# Patient Record
Sex: Female | Born: 1945
Health system: Southern US, Community
[De-identification: ages and names within clinical notes are randomized; demographics above are authoritative.]

## PROBLEM LIST (undated history)

## (undated) DIAGNOSIS — Z87442 Personal history of urinary calculi: Secondary | ICD-10-CM

## (undated) DIAGNOSIS — R079 Chest pain, unspecified: Secondary | ICD-10-CM

## (undated) DIAGNOSIS — R011 Cardiac murmur, unspecified: Secondary | ICD-10-CM

## (undated) DIAGNOSIS — I10 Essential (primary) hypertension: Secondary | ICD-10-CM

## (undated) DIAGNOSIS — L719 Rosacea, unspecified: Secondary | ICD-10-CM

## (undated) DIAGNOSIS — N939 Abnormal uterine and vaginal bleeding, unspecified: Secondary | ICD-10-CM

## (undated) DIAGNOSIS — M1990 Unspecified osteoarthritis, unspecified site: Secondary | ICD-10-CM

## (undated) DIAGNOSIS — M8580 Other specified disorders of bone density and structure, unspecified site: Secondary | ICD-10-CM

## (undated) DIAGNOSIS — R112 Nausea with vomiting, unspecified: Secondary | ICD-10-CM

## (undated) DIAGNOSIS — Z9889 Other specified postprocedural states: Secondary | ICD-10-CM

## (undated) HISTORY — DX: Other specified disorders of bone density and structure, unspecified site: M85.80

## (undated) HISTORY — PX: BREAST EXCISIONAL BIOPSY: SUR124

## (undated) HISTORY — DX: Essential (primary) hypertension: I10

## (undated) HISTORY — PX: OTHER SURGICAL HISTORY: SHX169

## (undated) HISTORY — DX: Chest pain, unspecified: R07.9

## (undated) HISTORY — PX: TONSILLECTOMY AND ADENOIDECTOMY: SUR1326

---

## 1977-03-13 DIAGNOSIS — Z9889 Other specified postprocedural states: Secondary | ICD-10-CM

## 1977-03-13 DIAGNOSIS — R112 Nausea with vomiting, unspecified: Secondary | ICD-10-CM

## 1977-03-13 HISTORY — DX: Other specified postprocedural states: Z98.890

## 1977-03-13 HISTORY — PX: LAPAROSCOPY: SHX197

## 1977-03-13 HISTORY — DX: Nausea with vomiting, unspecified: R11.2

## 1997-07-13 ENCOUNTER — Other Ambulatory Visit: Admission: RE | Admit: 1997-07-13 | Discharge: 1997-07-13 | Payer: Self-pay | Admitting: Gynecology

## 1998-07-15 ENCOUNTER — Other Ambulatory Visit: Admission: RE | Admit: 1998-07-15 | Discharge: 1998-07-15 | Payer: Self-pay | Admitting: Gynecology

## 1998-07-29 ENCOUNTER — Other Ambulatory Visit: Admission: RE | Admit: 1998-07-29 | Discharge: 1998-07-29 | Payer: Self-pay | Admitting: Gynecology

## 1999-02-21 ENCOUNTER — Encounter: Payer: Self-pay | Admitting: Gynecology

## 1999-02-21 ENCOUNTER — Encounter: Admission: RE | Admit: 1999-02-21 | Discharge: 1999-02-21 | Payer: Self-pay | Admitting: Gynecology

## 1999-07-27 ENCOUNTER — Other Ambulatory Visit: Admission: RE | Admit: 1999-07-27 | Discharge: 1999-07-27 | Payer: Self-pay | Admitting: Gynecology

## 1999-10-06 ENCOUNTER — Encounter: Admission: RE | Admit: 1999-10-06 | Discharge: 1999-10-06 | Payer: Self-pay | Admitting: Gynecology

## 1999-10-06 ENCOUNTER — Encounter: Payer: Self-pay | Admitting: Gynecology

## 2000-02-21 ENCOUNTER — Encounter: Admission: RE | Admit: 2000-02-21 | Discharge: 2000-02-21 | Payer: Self-pay | Admitting: Gynecology

## 2000-02-21 ENCOUNTER — Encounter: Payer: Self-pay | Admitting: Gynecology

## 2000-02-27 ENCOUNTER — Encounter: Payer: Self-pay | Admitting: Gynecology

## 2000-02-27 ENCOUNTER — Encounter: Admission: RE | Admit: 2000-02-27 | Discharge: 2000-02-27 | Payer: Self-pay | Admitting: Gynecology

## 2000-08-08 ENCOUNTER — Other Ambulatory Visit: Admission: RE | Admit: 2000-08-08 | Discharge: 2000-08-08 | Payer: Self-pay | Admitting: Gynecology

## 2000-08-14 ENCOUNTER — Encounter: Admission: RE | Admit: 2000-08-14 | Discharge: 2000-08-14 | Payer: Self-pay | Admitting: Gynecology

## 2000-08-14 ENCOUNTER — Encounter: Payer: Self-pay | Admitting: Gynecology

## 2001-02-22 ENCOUNTER — Encounter: Payer: Self-pay | Admitting: Gynecology

## 2001-02-22 ENCOUNTER — Encounter: Admission: RE | Admit: 2001-02-22 | Discharge: 2001-02-22 | Payer: Self-pay | Admitting: Gynecology

## 2001-08-09 ENCOUNTER — Other Ambulatory Visit: Admission: RE | Admit: 2001-08-09 | Discharge: 2001-08-09 | Payer: Self-pay | Admitting: Gynecology

## 2002-03-10 ENCOUNTER — Encounter: Payer: Self-pay | Admitting: Gynecology

## 2002-03-10 ENCOUNTER — Encounter: Admission: RE | Admit: 2002-03-10 | Discharge: 2002-03-10 | Payer: Self-pay | Admitting: Gynecology

## 2002-07-31 ENCOUNTER — Other Ambulatory Visit: Admission: RE | Admit: 2002-07-31 | Discharge: 2002-07-31 | Payer: Self-pay | Admitting: Gynecology

## 2003-02-19 ENCOUNTER — Encounter: Admission: RE | Admit: 2003-02-19 | Discharge: 2003-02-19 | Payer: Self-pay | Admitting: Gynecology

## 2003-08-06 ENCOUNTER — Other Ambulatory Visit: Admission: RE | Admit: 2003-08-06 | Discharge: 2003-08-06 | Payer: Self-pay | Admitting: Gynecology

## 2004-03-11 ENCOUNTER — Ambulatory Visit (HOSPITAL_COMMUNITY): Admission: RE | Admit: 2004-03-11 | Discharge: 2004-03-11 | Payer: Self-pay | Admitting: Gynecology

## 2004-03-31 ENCOUNTER — Encounter: Admission: RE | Admit: 2004-03-31 | Discharge: 2004-03-31 | Payer: Self-pay | Admitting: Gynecology

## 2004-07-26 ENCOUNTER — Other Ambulatory Visit: Admission: RE | Admit: 2004-07-26 | Discharge: 2004-07-26 | Payer: Self-pay | Admitting: Gynecology

## 2005-03-29 ENCOUNTER — Encounter: Admission: RE | Admit: 2005-03-29 | Discharge: 2005-03-29 | Payer: Self-pay | Admitting: Gynecology

## 2005-08-10 ENCOUNTER — Other Ambulatory Visit: Admission: RE | Admit: 2005-08-10 | Discharge: 2005-08-10 | Payer: Self-pay | Admitting: Gynecology

## 2005-11-09 ENCOUNTER — Encounter: Admission: RE | Admit: 2005-11-09 | Discharge: 2005-11-09 | Payer: Self-pay | Admitting: Family Medicine

## 2006-03-30 ENCOUNTER — Encounter: Admission: RE | Admit: 2006-03-30 | Discharge: 2006-03-30 | Payer: Self-pay | Admitting: Gynecology

## 2006-07-17 ENCOUNTER — Other Ambulatory Visit: Admission: RE | Admit: 2006-07-17 | Discharge: 2006-07-17 | Payer: Self-pay | Admitting: Gynecology

## 2007-04-04 ENCOUNTER — Encounter: Admission: RE | Admit: 2007-04-04 | Discharge: 2007-04-04 | Payer: Self-pay | Admitting: Gynecology

## 2008-04-06 ENCOUNTER — Encounter: Admission: RE | Admit: 2008-04-06 | Discharge: 2008-04-06 | Payer: Self-pay | Admitting: Gynecology

## 2009-04-08 ENCOUNTER — Encounter: Admission: RE | Admit: 2009-04-08 | Discharge: 2009-04-08 | Payer: Self-pay | Admitting: Gynecology

## 2010-03-13 DIAGNOSIS — I209 Angina pectoris, unspecified: Secondary | ICD-10-CM

## 2010-03-13 HISTORY — DX: Angina pectoris, unspecified: I20.9

## 2010-04-03 ENCOUNTER — Encounter: Payer: Self-pay | Admitting: Gynecology

## 2010-04-11 ENCOUNTER — Encounter
Admission: RE | Admit: 2010-04-11 | Discharge: 2010-04-11 | Payer: Self-pay | Source: Home / Self Care | Attending: Gynecology | Admitting: Gynecology

## 2010-04-12 ENCOUNTER — Other Ambulatory Visit: Payer: Self-pay | Admitting: Gynecology

## 2010-04-12 DIAGNOSIS — R928 Other abnormal and inconclusive findings on diagnostic imaging of breast: Secondary | ICD-10-CM

## 2010-04-19 ENCOUNTER — Ambulatory Visit
Admission: RE | Admit: 2010-04-19 | Discharge: 2010-04-19 | Disposition: A | Discharge status: Home / Self Care | Payer: Medicare Other | Source: Ambulatory Visit | Attending: Gynecology | Admitting: Gynecology

## 2010-04-19 DIAGNOSIS — R928 Other abnormal and inconclusive findings on diagnostic imaging of breast: Secondary | ICD-10-CM

## 2010-08-24 ENCOUNTER — Encounter: Payer: Self-pay | Admitting: Cardiology

## 2010-08-29 ENCOUNTER — Encounter: Payer: Self-pay | Admitting: Cardiology

## 2010-08-29 ENCOUNTER — Ambulatory Visit (INDEPENDENT_AMBULATORY_CARE_PROVIDER_SITE_OTHER): Payer: Medicare Other | Admitting: Cardiology

## 2010-08-29 DIAGNOSIS — E7800 Pure hypercholesterolemia, unspecified: Secondary | ICD-10-CM

## 2010-08-29 DIAGNOSIS — I10 Essential (primary) hypertension: Secondary | ICD-10-CM

## 2010-08-29 DIAGNOSIS — R079 Chest pain, unspecified: Secondary | ICD-10-CM

## 2010-08-29 DIAGNOSIS — E785 Hyperlipidemia, unspecified: Secondary | ICD-10-CM

## 2010-08-29 NOTE — Patient Instructions (Signed)
We will schedule you for a stress nuclear study.  Continue your current medications.

## 2010-08-30 ENCOUNTER — Encounter: Payer: Self-pay | Admitting: *Deleted

## 2010-08-30 DIAGNOSIS — E7800 Pure hypercholesterolemia, unspecified: Secondary | ICD-10-CM | POA: Insufficient documentation

## 2010-08-30 DIAGNOSIS — I10 Essential (primary) hypertension: Secondary | ICD-10-CM | POA: Insufficient documentation

## 2010-08-30 DIAGNOSIS — R079 Chest pain, unspecified: Secondary | ICD-10-CM | POA: Insufficient documentation

## 2010-08-30 NOTE — Assessment & Plan Note (Signed)
Symptoms are concerning for angina. Her risk factors are hypertension, mild hypercholesterolemia, and the fact that she is postmenopausal. I recommended a nuclear stress test to further stratify her risk. If abnormal she will need a cardiac catheterization. Also if abnormal we will need to consider lipid-lowering therapy.

## 2010-08-30 NOTE — Progress Notes (Signed)
[name] Date of Birth: 01-Jul-1945   History of Present Illness: Mrs. Cathy Greene is a pleasant 65 year old white female referred by Dr. Nicholas Lose for evaluation of exertional chest and elbow pain. She has a history of hypertension and mild dyslipidemia. She is postmenopausal and has been on hormone replacement therapy since her late 61s. She reports that over the past 2 months she has had symptoms of left elbow and chest pain when walking her dog. She describes as a severe ache in her left elbow and tightness in her mid chest. If she continues to walk it goes away eventually. It is relieved with rest. It occurs daily and virtually each time that she walks. She denies any shortness of breath, nausea vomiting, or diaphoresis. She does report a history of mitral valve prolapse and had an echocardiogram several years ago.  Current Outpatient Prescriptions on File Prior to Visit  Medication Sig Dispense Refill  . Calcium Carbonate-Vit D-Min (CALTRATE 600+D PLUS) 600-400 MG-UNIT per tablet Take 1 tablet by mouth 2 (two) times daily.        . Cholecalciferol (VITAMIN D PO) Take by mouth daily.        Marland Kitchen estradiol (VIVELLE-DOT) 0.075 MG/24HR Place 0.5 patches onto the skin 2 (two) times a week.       Marland Kitchen lisinopril-hydrochlorothiazide (PRINZIDE,ZESTORETIC) 20-25 MG per tablet Take 1 tablet by mouth daily.        . Multiple Vitamins-Minerals (ICAPS PO) Take 1 capsule by mouth 2 (two) times daily.        . progesterone (PROMETRIUM) 200 MG capsule Take 200 mg by mouth daily.        . Sulfacetamide Sodium 10 % SUSP 1 application as directed.        Allergies  Allergen Reactions  . Codeine     Past Medical History  Diagnosis Date  . Chest discomfort   . Hypertension   . Osteopenia   . Hypertrophic osteoarthritis   . Elbow pain, left     Past Surgical History  Procedure Date  . Cesarean section     X3  . Breast biopsy   . Breast cyst aspiration   . Tonsillectomy     History  Smoking status  .  Never Smoker   Smokeless tobacco  . Never Used    History  Alcohol Use No    Family History  Problem Relation Age of Onset  . Lung cancer Father   . Hypertension Mother   . Hypertension Sister   . Arthritis Mother   . Arthritis Sister   . Cancer Father   . Asthma Sister   . Diabetes Maternal Grandmother     Review of Systems: The review of systems is positive for arthritis.  All other systems were reviewed and are negative.  Physical Exam: BP 118/72  Pulse 80  Ht 5\' 6"  (1.676 m)  Wt 170 lb 8 oz (77.338 kg)  BMI 27.52 kg/m2 She is a pleasant white female in no acute distress. She is normocephalic, atraumatic. Pupils equal round react to light and accommodation. Extraocular movements are full. Sclera clear. Oropharynx is clear. Neck is supple without JVD, adenopathy, thyromegaly, or bruits. Lungs are clear. Cardiac exam reveals a regular rate and rhythm with a soft early systolic click. There is no murmur or gallop. Abdomen is soft and nontender without masses or hepatosplenomegaly. Femoral and pedal pulses are 2+ and symmetric. Skin is warm and dry. She is alert and oriented x3. Cranial nerves II  through XII are intact. LABORATORY DATA: Complete chemistry panel is normal. Cholesterol 197, LDL 115, HDL 58, triglycerides 82. ECG today is normal.  Assessment / Plan:

## 2010-08-31 ENCOUNTER — Encounter: Payer: Self-pay | Admitting: Cardiology

## 2010-09-05 ENCOUNTER — Ambulatory Visit (HOSPITAL_COMMUNITY): Payer: Medicare Other | Attending: Cardiology | Admitting: Radiology

## 2010-09-05 DIAGNOSIS — I10 Essential (primary) hypertension: Secondary | ICD-10-CM | POA: Insufficient documentation

## 2010-09-05 DIAGNOSIS — R079 Chest pain, unspecified: Secondary | ICD-10-CM | POA: Insufficient documentation

## 2010-09-05 DIAGNOSIS — I4949 Other premature depolarization: Secondary | ICD-10-CM

## 2010-09-05 DIAGNOSIS — R0789 Other chest pain: Secondary | ICD-10-CM

## 2010-09-05 DIAGNOSIS — I491 Atrial premature depolarization: Secondary | ICD-10-CM

## 2010-09-05 MED ORDER — TECHNETIUM TC 99M TETROFOSMIN IV KIT
11.0000 | PACK | Freq: Once | INTRAVENOUS | Status: AC | PRN
  Administered 2010-09-05: 11 via INTRAVENOUS

## 2010-09-05 MED ORDER — TECHNETIUM TC 99M TETROFOSMIN IV KIT
33.0000 | PACK | Freq: Once | INTRAVENOUS | Status: AC | PRN
  Administered 2010-09-05: 33 via INTRAVENOUS

## 2010-09-05 NOTE — Progress Notes (Signed)
[facility] SITE 3 NUCLEAR MED [address] [place] [address] [phone]  Cardiology Nuclear Med Study  TAILYN HANTZ is a 65 y.o. female [id number] 1945-04-26   Nuclear Med Background Indication for Stress Test:  Evaluation for Ischemia History:  ~10 yrs ago Echo:OK per patient, MVP. Cardiac Risk Factors: History of Smoking, Hypertension and Lipids  Symptoms:  Chest Tightness>Elbow with Exertion (last episode of chest discomfort was last week)   Nuclear Pre-Procedure Caffeine/Decaff Intake:  9:00pm NPO After: 7:30am   Lungs:  Clear. IV 0.9% NS with Angio Cath:  22g  IV Site: R Hand  IV Started by:  Cathlyn Parsons, RN  Chest Size (in):  36 Cup Size: B  Height: 5\' 6"  (1.676 m)  Weight:  168 lb (76.204 kg)  BMI:  Body mass index is 27.12 kg/(m^2). Tech Comments:  n/a    Nuclear Med Study 1 or 2 day study: 1 day  Stress Test Type:  Stress  Reading MD: Dietrich Pates, MD  Order Authorizing Provider:  Peter Swaziland, MD  Resting Radionuclide: Technetium 29m Tetrofosmin  Resting Radionuclide Dose: 11.0 mCi   Stress Radionuclide:  Technetium 1m Tetrofosmin  Stress Radionuclide Dose: 33.0 mCi           Stress Protocol Rest HR: 72 Stress HR: 155  Rest BP: 130/66 Stress BP: 185/78  Exercise Time (min): 6:00 METS: 7.0   Predicted Max HR: 154 bpm % Max HR: 100.65 bpm Rate Pressure Product: 78469   Dose of Adenosine (mg):  n/a Dose of Lexiscan: n/a mg  Dose of Atropine (mg): n/a Dose of Dobutamine: n/a mcg/kg/min (at max HR)  Stress Test Technologist: Smiley Houseman, CMA-N  Nuclear Technologist:  Domenic Polite, CNMT     Rest Procedure:  Myocardial perfusion imaging was performed at rest 45 minutes following the intravenous administration of Technetium 31m Tetrofosmin.  Rest ECG: No acute changes, rare PVC.  Stress Procedure:  The patient exercised for six minutes on the treadmill utilizing the Bruce protocol.  The patient stopped due to fatigue and  denied any chest pain.  There were no significant ST-T wave changes, only occasional PVC's and PAC's.  Technetium 26m Tetrofosmin was injected at peak exercise and myocardial perfusion imaging was performed after a brief delay.  Stress ECG: No significant change from baseline ECG  QPS Raw Data Images:  Soft tissue (diaphragm) underlies heart. Stress Images:  Normal homogeneous uptake in all areas of the myocardium. Rest Images:  Normal homogeneous uptake in all areas of the myocardium. Subtraction (SDS):  No evidence of ischemia. Transient Ischemic Dilatation (Normal <1.22):  1.06 Lung/Heart Ratio (Normal <0.45):  0.32  Quantitative Gated Spect Images QGS EDV:  70 ml QGS ESV:  24 ml QGS cine images:  NL LV Function; NL Wall Motion QGS EF: 66%  Impression Exercise Capacity:  Fair exercise capacity. BP Response:  Normal blood pressure response. Clinical Symptoms:  No chest pain. ECG Impression:  No significant ST segment change suggestive of ischemia. Comparison with Prior Nuclear Study: No previous nuclear study performed  Overall Impression:  Normal stress nuclear study.

## 2010-09-06 NOTE — Progress Notes (Signed)
Nuc med report routed to Dr. Swaziland 09/06/10 Domenic Polite

## 2010-09-06 NOTE — Progress Notes (Signed)
Nuclear stress test is normal. EF is normal. This is reassuring but I would like to see her in 3 weeks or so to see how her chest pain is doing. She should call if her pain worsens.

## 2010-09-07 NOTE — Progress Notes (Signed)
Lm to call w/ nuclear stress test results. Advised to call back.

## 2010-09-07 NOTE — Progress Notes (Signed)
Called back. Gave her results. Made her an app to see [place] 7/20. States her pain was minimal yest when she walked. Advised to call us if pain worsens or go to ER at [facility].

## 2010-09-30 ENCOUNTER — Ambulatory Visit (INDEPENDENT_AMBULATORY_CARE_PROVIDER_SITE_OTHER): Payer: Medicare Other | Admitting: Cardiology

## 2010-09-30 ENCOUNTER — Encounter: Payer: Self-pay | Admitting: Cardiology

## 2010-09-30 DIAGNOSIS — R079 Chest pain, unspecified: Secondary | ICD-10-CM

## 2010-09-30 DIAGNOSIS — I10 Essential (primary) hypertension: Secondary | ICD-10-CM

## 2010-09-30 DIAGNOSIS — E7800 Pure hypercholesterolemia, unspecified: Secondary | ICD-10-CM

## 2010-09-30 NOTE — Progress Notes (Signed)
   Cathy Greene Date of Birth: 31-Aug-1945   History of Present Illness: Cathy Greene is seen for followup after her recent stress Myoview study. Since her last visit her chest discomfort symptoms have improved significantly. She actually visiting family in [place] and did walking at altitude without any discomfort. She thinks it is thought she meant air here that is associated with her pain. She still has had some minor symptoms walking here but it is improved.  Current Outpatient Prescriptions on File Prior to Visit  Medication Sig Dispense Refill  . Cholecalciferol (VITAMIN D PO) Take by mouth daily.        Marland Kitchen estradiol (VIVELLE-DOT) 0.075 MG/24HR Place 0.5 patches onto the skin once a week.       Marland Kitchen lisinopril-hydrochlorothiazide (PRINZIDE,ZESTORETIC) 20-25 MG per tablet Take 1 tablet by mouth daily.        . Multiple Vitamins-Minerals (ICAPS PO) Take 1 capsule by mouth 2 (two) times daily.       . progesterone (PROMETRIUM) 200 MG capsule Take 200 mg by mouth every 3 (three) months.       . Sulfacetamide Sodium 10 % SUSP 1 application as directed.        Allergies  Allergen Reactions  . Codeine     Past Medical History  Diagnosis Date  . Chest discomfort   . Hypertension   . Osteopenia   . Hypertrophic osteoarthritis   . Elbow pain, left     Past Surgical History  Procedure Date  . Cesarean section     X3  . Breast biopsy   . Breast cyst aspiration   . Tonsillectomy     History  Smoking status  . Never Smoker   Smokeless tobacco  . Never Used    History  Alcohol Use No    Family History  Problem Relation Age of Onset  . Lung cancer Father   . Hypertension Mother   . Hypertension Sister   . Arthritis Mother   . Arthritis Sister   . Cancer Father   . Asthma Sister   . Diabetes Maternal Grandmother     Review of Systems: The review of systems is positive for arthritis.  All other systems were reviewed and are negative.  Physical Exam: BP 140/88   Pulse 76  Ht 5\' 5"  (1.651 m)  Wt 170 lb (77.111 kg)  BMI 28.29 kg/m2 She is a pleasant white female in no acute distress. She is normocephalic, atraumatic. Pupils equal round react to light and accommodation. Extraocular movements are full. Sclera clear. Oropharynx is clear. Neck is supple without JVD, adenopathy, thyromegaly, or bruits. Lungs are clear. Cardiac exam reveals a regular rate and rhythm with a soft early systolic click. There is no murmur or gallop. Abdomen is soft and nontender without masses or hepatosplenomegaly. Femoral and pedal pulses are 2+ and symmetric. Skin is warm and dry. She is alert and oriented x3. Cranial nerves II through XII are intact. LABORATORY DATA: She was able to walk for 6 minutes on the treadmill without any symptoms of chest pain. Her ECG showed no acute changes. Her Cardiolite images were normal with a normal ejection fraction.  Assessment / Plan:

## 2010-09-30 NOTE — Assessment & Plan Note (Signed)
Her blood pressure is well controlled on current medications. 

## 2010-09-30 NOTE — Assessment & Plan Note (Signed)
Her stress Myoview would indicate a low risk of coronary disease. Her chest symptoms are abating. I would recommend continued risk factor modification. If she has any worsening chest pain then we will need to reevaluate.

## 2010-09-30 NOTE — Patient Instructions (Signed)
Follow a heart healthy diet.  Get regular exercise.  Call if you have any worsening chest pain.

## 2010-09-30 NOTE — Assessment & Plan Note (Signed)
Previous LDL was 115. I recommended last modification with a heart healthy diet and regular aerobic exercise. Her goal would be to get her LDL less than 100.

## 2011-03-14 HISTORY — PX: OTHER SURGICAL HISTORY: SHX169

## 2011-03-23 ENCOUNTER — Other Ambulatory Visit: Payer: Self-pay | Admitting: Family Medicine

## 2011-03-23 DIAGNOSIS — Z1231 Encounter for screening mammogram for malignant neoplasm of breast: Secondary | ICD-10-CM

## 2011-04-21 ENCOUNTER — Ambulatory Visit
Admission: RE | Admit: 2011-04-21 | Discharge: 2011-04-21 | Disposition: A | Discharge status: Home / Self Care | Payer: Medicare Other | Source: Ambulatory Visit | Attending: Family Medicine | Admitting: Family Medicine

## 2011-04-21 DIAGNOSIS — Z1231 Encounter for screening mammogram for malignant neoplasm of breast: Secondary | ICD-10-CM

## 2011-07-28 ENCOUNTER — Encounter (HOSPITAL_BASED_OUTPATIENT_CLINIC_OR_DEPARTMENT_OTHER): Payer: Self-pay | Admitting: *Deleted

## 2011-07-28 NOTE — Progress Notes (Signed)
NPO AFTER MN. 1610. NEEDS ISTAT. CURRENT EKG IN CHART.

## 2011-07-31 ENCOUNTER — Encounter (HOSPITAL_BASED_OUTPATIENT_CLINIC_OR_DEPARTMENT_OTHER)
Admission: RE | Disposition: A | Discharge status: Home / Self Care | Payer: Self-pay | Source: Ambulatory Visit | Attending: Gynecology

## 2011-07-31 ENCOUNTER — Encounter (HOSPITAL_BASED_OUTPATIENT_CLINIC_OR_DEPARTMENT_OTHER): Payer: Self-pay | Admitting: *Deleted

## 2011-07-31 ENCOUNTER — Ambulatory Visit (HOSPITAL_BASED_OUTPATIENT_CLINIC_OR_DEPARTMENT_OTHER)
Admission: RE | Admit: 2011-07-31 | Discharge: 2011-07-31 | Disposition: A | Discharge status: Home / Self Care | Payer: Medicare Other | Source: Ambulatory Visit | Attending: Gynecology | Admitting: Gynecology

## 2011-07-31 ENCOUNTER — Encounter (HOSPITAL_BASED_OUTPATIENT_CLINIC_OR_DEPARTMENT_OTHER): Payer: Self-pay | Admitting: Anesthesiology

## 2011-07-31 ENCOUNTER — Ambulatory Visit (HOSPITAL_BASED_OUTPATIENT_CLINIC_OR_DEPARTMENT_OTHER): Payer: Medicare Other | Admitting: Anesthesiology

## 2011-07-31 DIAGNOSIS — I10 Essential (primary) hypertension: Secondary | ICD-10-CM | POA: Insufficient documentation

## 2011-07-31 DIAGNOSIS — N950 Postmenopausal bleeding: Secondary | ICD-10-CM | POA: Insufficient documentation

## 2011-07-31 DIAGNOSIS — R9389 Abnormal findings on diagnostic imaging of other specified body structures: Secondary | ICD-10-CM | POA: Insufficient documentation

## 2011-07-31 HISTORY — DX: Other specified postprocedural states: Z98.890

## 2011-07-31 HISTORY — DX: Unspecified osteoarthritis, unspecified site: M19.90

## 2011-07-31 HISTORY — DX: Abnormal uterine and vaginal bleeding, unspecified: N93.9

## 2011-07-31 HISTORY — DX: Rosacea, unspecified: L71.9

## 2011-07-31 HISTORY — DX: Nausea with vomiting, unspecified: R11.2

## 2011-07-31 LAB — POCT I-STAT 4, (NA,K, GLUC, HGB,HCT): Hemoglobin: 13.9 g/dL (ref 12.0–15.0)

## 2011-07-31 SURGERY — DILATATION & CURETTAGE/HYSTEROSCOPY WITH RESECTOCOPE
Anesthesia: General | Site: Uterus | Wound class: Clean Contaminated

## 2011-07-31 MED ORDER — PROPOFOL 10 MG/ML IV EMUL
INTRAVENOUS | Status: DC | PRN
  Administered 2011-07-31: 180 mg via INTRAVENOUS

## 2011-07-31 MED ORDER — KETOROLAC TROMETHAMINE 30 MG/ML IJ SOLN
INTRAMUSCULAR | Status: DC | PRN
  Administered 2011-07-31: 30 mg via INTRAVENOUS

## 2011-07-31 MED ORDER — LIDOCAINE HCL (CARDIAC) 20 MG/ML IV SOLN
INTRAVENOUS | Status: DC | PRN
  Administered 2011-07-31: 60 mg via INTRAVENOUS

## 2011-07-31 MED ORDER — LACTATED RINGERS IV SOLN
INTRAVENOUS | Status: DC
  Administered 2011-07-31 (×3): via INTRAVENOUS

## 2011-07-31 MED ORDER — PROMETHAZINE HCL 25 MG/ML IJ SOLN: 6.2500 mg | INTRAMUSCULAR | Status: DC | PRN

## 2011-07-31 MED ORDER — LACTATED RINGERS IV SOLN: INTRAVENOUS | Status: DC

## 2011-07-31 MED ORDER — GLYCINE 1.5 % IR SOLN
Status: DC | PRN
  Administered 2011-07-31: 6000 mL

## 2011-07-31 MED ORDER — MIDAZOLAM HCL 5 MG/5ML IJ SOLN
INTRAMUSCULAR | Status: DC | PRN
  Administered 2011-07-31: 1 mg via INTRAVENOUS

## 2011-07-31 MED ORDER — DEXAMETHASONE SODIUM PHOSPHATE 4 MG/ML IJ SOLN
INTRAMUSCULAR | Status: DC | PRN
  Administered 2011-07-31: 10 mg via INTRAVENOUS

## 2011-07-31 MED ORDER — FENTANYL CITRATE 0.05 MG/ML IJ SOLN: 25.0000 ug | INTRAMUSCULAR | Status: DC | PRN

## 2011-07-31 MED ORDER — LIDOCAINE HCL (PF) 1 % IJ SOLN
INTRAMUSCULAR | Status: DC | PRN
  Administered 2011-07-31: 17 mL

## 2011-07-31 MED ORDER — FENTANYL CITRATE 0.05 MG/ML IJ SOLN
INTRAMUSCULAR | Status: DC | PRN
  Administered 2011-07-31: 25 ug via INTRAVENOUS
  Administered 2011-07-31 (×2): 50 ug via INTRAVENOUS

## 2011-07-31 SURGICAL SUPPLY — 28 items
CANISTER SUCTION 2500CC (MISCELLANEOUS) ×2 IMPLANT
CATH ROBINSON RED A/P 16FR (CATHETERS) IMPLANT
CLOTH BEACON ORANGE TIMEOUT ST (SAFETY) ×2 IMPLANT
CORD ACTIVE DISPOSABLE (ELECTRODE) ×1
CORD ELECTRO ACTIVE DISP (ELECTRODE) ×1 IMPLANT
COVER TABLE BACK 60X90 (DRAPES) ×2 IMPLANT
DRAPE CAMERA CLOSED 9X96 (DRAPES) ×2 IMPLANT
DRAPE LG THREE QUARTER DISP (DRAPES) ×2 IMPLANT
ELECT LOOP GYNE PRO 24FR (CUTTING LOOP) ×2
ELECT REM PT RETURN 9FT ADLT (ELECTROSURGICAL) ×2
ELECT VAPORTRODE GRVD BAR (ELECTRODE) IMPLANT
ELECTRODE LOOP GYNE PRO 24FR (CUTTING LOOP) ×1 IMPLANT
ELECTRODE REM PT RTRN 9FT ADLT (ELECTROSURGICAL) ×1 IMPLANT
GLOVE BIO SURGEON STRL SZ 6.5 (GLOVE) ×2 IMPLANT
GLOVE BIO SURGEON STRL SZ8 (GLOVE) ×4 IMPLANT
GLOVE ECLIPSE 6.0 STRL STRAW (GLOVE) ×2 IMPLANT
GOWN STRL NON-REIN LRG LVL3 (GOWN DISPOSABLE) ×2 IMPLANT
GOWN STRL REIN XL XLG (GOWN DISPOSABLE) ×2 IMPLANT
LEGGING LITHOTOMY PAIR STRL (DRAPES) ×2 IMPLANT
PACK BASIN DAY SURGERY FS (CUSTOM PROCEDURE TRAY) ×2 IMPLANT
PAD OB MATERNITY 4.3X12.25 (PERSONAL CARE ITEMS) ×2 IMPLANT
PAD PREP 24X48 CUFFED NSTRL (MISCELLANEOUS) ×2 IMPLANT
SET BERKELEY SUCTION TUBING (SUCTIONS) ×2 IMPLANT
SYR CONTROL 10ML LL (SYRINGE) ×2 IMPLANT
TOWEL OR 17X24 6PK STRL BLUE (TOWEL DISPOSABLE) IMPLANT
TRAY DSU PREP LF (CUSTOM PROCEDURE TRAY) ×2 IMPLANT
TUBING HYDROFLEX HYSTEROSCOPY (TUBING) ×2 IMPLANT
WATER STERILE IRR 500ML POUR (IV SOLUTION) ×2 IMPLANT

## 2011-07-31 NOTE — Discharge Instructions (Signed)

## 2011-07-31 NOTE — OR Nursing (Signed)
Deficit was 

## 2011-07-31 NOTE — Anesthesia Preprocedure Evaluation (Signed)
Anesthesia Evaluation  Patient identified by MRN, date of birth, ID band Patient awake    Reviewed: Allergy & Precautions, H&P , NPO status , Patient's Chart, lab work & pertinent test results  History of Anesthesia Complications (+) PONV  Airway Mallampati: II TM Distance: >3 FB Neck ROM: Full    Dental No notable dental hx.    Pulmonary neg pulmonary ROS,  breath sounds clear to auscultation  Pulmonary exam normal       Cardiovascular Exercise Tolerance: Good hypertension, Rhythm:Regular Rate:Normal  Dr. Elvis Coil note of 09/30/10 reviewed. H/O low-risk myoview.   Neuro/Psych negative neurological ROS  negative psych ROS   GI/Hepatic negative GI ROS, Neg liver ROS,   Endo/Other  negative endocrine ROS  Renal/GU negative Renal ROS  negative genitourinary   Musculoskeletal negative musculoskeletal ROS (+)   Abdominal   Peds negative pediatric ROS (+)  Hematology negative hematology ROS (+)   Anesthesia Other Findings   Reproductive/Obstetrics negative OB ROS                           Anesthesia Physical Anesthesia Plan  ASA: II  Anesthesia Plan: General   Post-op Pain Management:    Induction: Intravenous  Airway Management Planned: LMA  Additional Equipment:   Intra-op Plan:   Post-operative Plan: Extubation in OR  Informed Consent: I have reviewed the patients History and Physical, chart, labs and discussed the procedure including the risks, benefits and alternatives for the proposed anesthesia with the patient or authorized representative who has indicated his/her understanding and acceptance.   Dental advisory given  Plan Discussed with: CRNA  Anesthesia Plan Comments: (Denies GERD)        Anesthesia Quick Evaluation

## 2011-07-31 NOTE — Anesthesia Procedure Notes (Signed)
Procedure Name: LMA Insertion Date/Time: 07/31/2011 7:42 AM Performed by: Fran Lowes Pre-anesthesia Checklist: Patient identified, Emergency Drugs available, Suction available and Patient being monitored Patient Re-evaluated:Patient Re-evaluated prior to inductionOxygen Delivery Method: Circle System Utilized Preoxygenation: Pre-oxygenation with 100% oxygen Intubation Type: IV induction Ventilation: Mask ventilation without difficulty LMA: LMA inserted LMA Size: 4.0 Number of attempts: 1 Airway Equipment and Method: bite block Placement Confirmation: positive ETCO2 Tube secured with: Tape Dental Injury: Teeth and Oropharynx as per pre-operative assessment

## 2011-07-31 NOTE — Anesthesia Postprocedure Evaluation (Signed)
  Anesthesia Post-op Note  Patient: Cathy Greene  Procedure(s) Performed: Procedure(s) (LRB): DILATATION & CURETTAGE/HYSTEROSCOPY WITH RESECTOCOPE (N/A)  Patient Location: PACU  Anesthesia Type: General  Level of Consciousness: awake and alert   Airway and Oxygen Therapy: Patient Spontanous Breathing  Post-op Pain: mild  Post-op Assessment: Post-op Vital signs reviewed, Patient's Cardiovascular Status Stable, Respiratory Function Stable, Patent Airway and No signs of Nausea or vomiting  Post-op Vital Signs: stable  Complications: No apparent anesthesia complications

## 2011-07-31 NOTE — Transfer of Care (Signed)
Immediate Anesthesia Transfer of Care Note  Patient: Cathy Greene  Procedure(s) Performed: Procedure(s) (LRB): DILATATION & CURETTAGE/HYSTEROSCOPY WITH RESECTOCOPE (N/A)  Patient Location: Patient transported to PACU with oxygen via face mask at 4 Liters / Min  Anesthesia Type: General  Level of Consciousness: awake and alert   Airway & Oxygen Therapy: Patient Spontanous Breathing and Patient connected to face mask oxygen  Post-op Assessment: Report given to PACU RN and Post -op Vital signs reviewed and stable  Post vital signs: Reviewed and stable  Dentition: Teeth and oropharynx remain in pre-op condition  Complications: No apparent anesthesia complications

## 2011-07-31 NOTE — Op Note (Signed)
Cathy Greene, Cathy Greene                 ACCOUNT NO.:  [id number]  MEDICAL RECORD NO.:  [id number]  LOCATION:                               FACILITY:  [facility]  PHYSICIAN:  Gretta Cool, M.D. DATE OF BIRTH:  April 09, 1945  DATE OF PROCEDURE:  07/31/2011 DATE OF DISCHARGE:                              OPERATIVE REPORT   PREOPERATIVE DIAGNOSIS:  Postmenopausal bleeding with thickened endometrium by ultrasound.  POSTOPERATIVE DIAGNOSIS:  Postmenopausal bleeding with thickened endometrium by ultrasound.  PROCEDURE:  Hysteroscopy, endometrial sampling, and resection of thickened areas of endometrial tissue.  ANESTHESIA:  LMA and paracervical block.  DESCRIPTION OF PROCEDURE:  Under excellent anesthesia as above with the patient prepped and draped in Allen stirrups, a Graves speculum was placed in the vagina and the cervix grasped with single-tooth tenaculum. It was then progressively dilated with a series of __________ and then Shawnie Pons dilators to accommodate 7-mm resectoscope.  The cavity was then examined and photographed.  There were thickened areas in the cornual area particularly in the left cornual area.  There was no chronic polyp, no evidence of fibroid intruding into the cavity.  At this point, the thickened areas were resected out into the cornual area.  All of the tissue that appeared thickened was resected for examination by pathology.  At the end of the procedure, there was no significant bleeding.  At reduced pressure, there remained no significant bleeding. Procedure was then terminated.  The patient was returned to the recovery room in excellent condition.  Fluid deficit, 135 mL.  Complications, none.          ______________________________ Gretta Cool, M.D.     CWL/MEDQ  D:  07/31/2011  T:  07/31/2011  Job:  [id number]  cc:   Cam Hai, C.N.M.

## 2011-08-03 ENCOUNTER — Other Ambulatory Visit: Payer: Self-pay | Admitting: Gynecology

## 2012-01-25 ENCOUNTER — Emergency Department (HOSPITAL_COMMUNITY): Payer: Medicare Other

## 2012-01-25 ENCOUNTER — Emergency Department (HOSPITAL_COMMUNITY)
Admission: EM | Admit: 2012-01-25 | Discharge: 2012-01-25 | Disposition: A | Discharge status: Home / Self Care | Payer: Medicare Other | Attending: Emergency Medicine | Admitting: Emergency Medicine

## 2012-01-25 ENCOUNTER — Encounter (HOSPITAL_COMMUNITY): Payer: Self-pay | Admitting: *Deleted

## 2012-01-25 DIAGNOSIS — M1710 Unilateral primary osteoarthritis, unspecified knee: Secondary | ICD-10-CM | POA: Insufficient documentation

## 2012-01-25 DIAGNOSIS — M19049 Primary osteoarthritis, unspecified hand: Secondary | ICD-10-CM | POA: Insufficient documentation

## 2012-01-25 DIAGNOSIS — Z79899 Other long term (current) drug therapy: Secondary | ICD-10-CM | POA: Insufficient documentation

## 2012-01-25 DIAGNOSIS — R112 Nausea with vomiting, unspecified: Secondary | ICD-10-CM | POA: Insufficient documentation

## 2012-01-25 DIAGNOSIS — IMO0002 Reserved for concepts with insufficient information to code with codable children: Secondary | ICD-10-CM | POA: Insufficient documentation

## 2012-01-25 DIAGNOSIS — M899 Disorder of bone, unspecified: Secondary | ICD-10-CM | POA: Insufficient documentation

## 2012-01-25 DIAGNOSIS — M949 Disorder of cartilage, unspecified: Secondary | ICD-10-CM | POA: Insufficient documentation

## 2012-01-25 DIAGNOSIS — N200 Calculus of kidney: Secondary | ICD-10-CM | POA: Insufficient documentation

## 2012-01-25 DIAGNOSIS — L719 Rosacea, unspecified: Secondary | ICD-10-CM | POA: Insufficient documentation

## 2012-01-25 DIAGNOSIS — I10 Essential (primary) hypertension: Secondary | ICD-10-CM | POA: Insufficient documentation

## 2012-01-25 LAB — CBC WITH DIFFERENTIAL/PLATELET
Eosinophils Absolute: 0.2 10*3/uL (ref 0.0–0.7)
Hemoglobin: 14 g/dL (ref 12.0–15.0)
Lymphocytes Relative: 19 % (ref 12–46)
Lymphs Abs: 1.7 10*3/uL (ref 0.7–4.0)
Monocytes Relative: 7 % (ref 3–12)
Neutro Abs: 6.3 10*3/uL (ref 1.7–7.7)
Neutrophils Relative %: 71 % (ref 43–77)
Platelets: 269 10*3/uL (ref 150–400)
RBC: 4.62 MIL/uL (ref 3.87–5.11)
WBC: 8.9 10*3/uL (ref 4.0–10.5)

## 2012-01-25 LAB — BASIC METABOLIC PANEL
CO2: 27 mEq/L (ref 19–32)
Chloride: 99 mEq/L (ref 96–112)
GFR calc non Af Amer: >90 mL/min (ref >90)
Glucose, Bld: 114 mg/dL — ABNORMAL HIGH (ref 70–99)
Potassium: 3.1 mEq/L — ABNORMAL LOW (ref 3.5–5.1)
Sodium: 137 mEq/L (ref 135–145)

## 2012-01-25 LAB — URINALYSIS, ROUTINE W REFLEX MICROSCOPIC
Leukocytes, UA: NEGATIVE
Nitrite: NEGATIVE
Specific Gravity, Urine: 1.02 (ref 1.005–1.030)
Urobilinogen, UA: 0.2 mg/dL (ref 0.0–1.0)
pH: 5.5 (ref 5.0–8.0)

## 2012-01-25 LAB — URINE MICROSCOPIC-ADD ON

## 2012-01-25 MED ORDER — ONDANSETRON HCL 4 MG PO TABS: 4.0000 mg | ORAL_TABLET | Freq: Four times a day (QID) | ORAL | Status: DC

## 2012-01-25 MED ORDER — POTASSIUM CHLORIDE CRYS ER 20 MEQ PO TBCR
40.0000 meq | EXTENDED_RELEASE_TABLET | Freq: Once | ORAL | Status: AC
  Administered 2012-01-25: 40 meq via ORAL
  Filled 2012-01-25: qty 2

## 2012-01-25 MED ORDER — OXYCODONE-ACETAMINOPHEN 5-325 MG PO TABS: 2.0000 | ORAL_TABLET | ORAL | Status: DC | PRN

## 2012-01-25 MED ORDER — HYDROMORPHONE HCL PF 1 MG/ML IJ SOLN
0.5000 mg | Freq: Once | INTRAMUSCULAR | Status: AC
  Administered 2012-01-25: 0.5 mg via INTRAVENOUS
  Filled 2012-01-25: qty 1

## 2012-01-25 MED ORDER — ONDANSETRON HCL 4 MG/2ML IJ SOLN
4.0000 mg | Freq: Once | INTRAMUSCULAR | Status: AC
  Administered 2012-01-25: 4 mg via INTRAVENOUS
  Filled 2012-01-25: qty 2

## 2012-01-25 MED ORDER — PROMETHAZINE HCL 25 MG/ML IJ SOLN
12.5000 mg | Freq: Once | INTRAMUSCULAR | Status: AC
  Administered 2012-01-25: 12.5 mg via INTRAVENOUS
  Filled 2012-01-25: qty 1

## 2012-01-25 MED ORDER — SODIUM CHLORIDE 0.9 % IV BOLUS (SEPSIS)
1000.0000 mL | Freq: Once | INTRAVENOUS | Status: AC
  Administered 2012-01-25: 1000 mL via INTRAVENOUS

## 2012-01-25 MED ORDER — PROMETHAZINE HCL 25 MG/ML IJ SOLN
12.5000 mg | Freq: Once | INTRAMUSCULAR | Status: DC
  Filled 2012-01-25: qty 1

## 2012-01-25 MED ORDER — KETOROLAC TROMETHAMINE 30 MG/ML IJ SOLN
30.0000 mg | Freq: Once | INTRAMUSCULAR | Status: AC
Start: 2012-01-25 — End: 2012-01-25
  Administered 2012-01-25: 30 mg via INTRAVENOUS
  Filled 2012-01-25: qty 1

## 2012-01-25 MED ORDER — HYDROMORPHONE HCL PF 1 MG/ML IJ SOLN
1.0000 mg | Freq: Once | INTRAMUSCULAR | Status: AC
  Administered 2012-01-25: 1 mg via INTRAVENOUS
  Filled 2012-01-25: qty 1

## 2012-01-25 NOTE — ED Notes (Signed)
Pt. Aware of In and Out Cath. Attempted x 2 but unable to get urine. Notified nurse

## 2012-01-25 NOTE — ED Notes (Signed)
Bed:WA11<BR> Expected date:<BR> Expected time:<BR> Means of arrival:<BR> Comments:<BR> Triage

## 2012-01-25 NOTE — ED Notes (Signed)
Pt states about an hour ago started having severe L sided flank pain, 10/10, nausea/vomiting present, denies diarrhea, denies hx of kidney stones, denies urinary symptoms.

## 2012-01-25 NOTE — ED Provider Notes (Signed)
Care assumed from Langley Adie, [place].  Pt with L flank pain, no Hx of kidney stone, a febrile.  CT with 2mm stone at the [facility].  Awaiting UA.  Plan:  If no evidence of UTI, d/c home.  If UTI treat with Rocephin 1g IV and then d/c home.   UA without evidence of UTI.  Will discharge home with pain and nausea control.    1. Medications: percocet, zofran  2. Treatment: rest, drink plenty of fluids  3. Follow Up: with your PCP and urology as listed below     [facility], PA-C 01/25/12 2059

## 2012-01-25 NOTE — ED Provider Notes (Signed)
Medical screening examination/treatment/procedure(s) were performed by non-physician practitioner and as supervising physician I was immediately available for consultation/collaboration.   Richardean Canal, MD 01/25/12 [phone]

## 2012-01-25 NOTE — ED Notes (Signed)
PA at bedside.

## 2012-01-25 NOTE — ED Provider Notes (Signed)
Medical screening examination/treatment/procedure(s) were performed by non-physician practitioner and as supervising physician I was immediately available for consultation/collaboration.   David H Yao, MD 01/25/12 2301 

## 2012-01-25 NOTE — ED Provider Notes (Signed)
History     CSN: [id number]  Arrival date & time 01/25/12  1517   First MD Initiated Contact with Patient 01/25/12 1546      Chief Complaint  Patient presents with  . Flank Pain  . Nausea  . Emesis    (Consider location/radiation/quality/duration/timing/severity/associated sxs/prior treatment) Patient is a 66 y.o. female presenting with flank pain and vomiting. The history is provided by the patient.  Flank Pain This is a new problem. The current episode started today. The problem occurs constantly. The problem has been unchanged. Associated symptoms include nausea and vomiting. Pertinent negatives include no abdominal pain, chest pain, chills, fever or weakness. Associated symptoms comments: Sudden onset of left flank pain associated with nausea and vomiting. No fever. She denies urinary frequency or hematuria. No history of kidney stones. She denies known history of vasculopathies or arterial disease. .  Emesis  Pertinent negatives include no abdominal pain, no chills and no fever.    Past Medical History  Diagnosis Date  . Hypertension   . Osteopenia   . Arthritis FINGERS AND KNEES  . Abnormal uterine bleeding   . Rosacea   . PONV (postoperative nausea and vomiting)     Past Surgical History  Procedure Date  . Cesarean section     X3   LAST ONE 1984  W/ TUBAL LIGATION  . Tonsillectomy and adenoidectomy AGE 64  . Benign breast bx     Family History  Problem Relation Age of Onset  . Lung cancer Father   . Hypertension Mother   . Hypertension Sister   . Arthritis Mother   . Arthritis Sister   . Cancer Father   . Asthma Sister   . Diabetes Maternal Grandmother     History  Substance Use Topics  . Smoking status: Never Smoker   . Smokeless tobacco: Never Used  . Alcohol Use: No    OB History    Grav Para Term Preterm Abortions TAB SAB Ect Mult Living                  Review of Systems  Constitutional: Negative for fever and chills.  HENT: Negative.     Respiratory: Negative.   Cardiovascular: Negative.  Negative for chest pain.  Gastrointestinal: Positive for nausea and vomiting. Negative for abdominal pain.  Genitourinary: Positive for flank pain. Negative for dysuria and hematuria.  Musculoskeletal: Negative.   Skin: Negative.   Neurological: Negative.  Negative for dizziness, syncope and weakness.    Allergies  Codeine  Home Medications   Current Outpatient Rx  Name  Route  Sig  Dispense  Refill  . CALCIUM CARB-CHOLECALCIFEROL 500-400 MG-UNIT PO TABS   Oral   Take by mouth.         Marland Kitchen VITAMIN D PO   Oral   Take 2,000 Units by mouth daily.          Marland Kitchen ESTRADIOL 0.075 MG/24HR TD PTTW   Transdermal   Place 0.5 patches onto the skin once a week.          Marland Kitchen LISINOPRIL-HYDROCHLOROTHIAZIDE 20-25 MG PO TABS   Oral   Take 0.5 tablets by mouth daily.          Marland Kitchen METRONIDAZOLE 0.75 % EX CREA   Topical   Apply 1 application topically 2 (two) times daily.         Marland Kitchen PROGESTERONE MICRONIZED 200 MG PO CAPS   Oral   Take 200 mg by mouth every 3 (three)  months. DAYS 1  -  12           BP 132/56  Pulse 95  Temp 98.1 F (36.7 C) (Oral)  Resp 20  SpO2 99%  Physical Exam  Constitutional: She appears well-developed and well-nourished.  HENT:  Head: Normocephalic.  Neck: Normal range of motion. Neck supple.  Cardiovascular: Normal rate and regular rhythm.   No murmur heard. Pulmonary/Chest: Effort normal and breath sounds normal.  Abdominal: Soft. Bowel sounds are normal. There is no tenderness. There is no rebound and no guarding.  Genitourinary:       Left flank minimally tender. No swelling.  Musculoskeletal: Normal range of motion.  Neurological: She is alert. No cranial nerve deficit.  Skin: Skin is warm and dry. No rash noted.  Psychiatric: She has a normal mood and affect.    ED Course  Procedures (including critical care time)   Labs Reviewed  URINALYSIS, ROUTINE W REFLEX MICROSCOPIC  CBC WITH  DIFFERENTIAL  BASIC METABOLIC PANEL   No results found.   No diagnosis found.    MDM  Pain controlled. UA pending. Patient care given to PA Muthersbaugh.        Rodena Medin, PA-C 01/25/12 2007

## 2012-01-25 NOTE — ED Notes (Signed)
After last pain meds, PA requesting another I&O cath be attempted.

## 2012-02-11 DIAGNOSIS — Z87442 Personal history of urinary calculi: Secondary | ICD-10-CM

## 2012-02-11 HISTORY — DX: Personal history of urinary calculi: Z87.442

## 2012-03-22 ENCOUNTER — Other Ambulatory Visit: Payer: Self-pay | Admitting: Family Medicine

## 2012-03-22 DIAGNOSIS — Z1231 Encounter for screening mammogram for malignant neoplasm of breast: Secondary | ICD-10-CM

## 2012-04-23 ENCOUNTER — Ambulatory Visit
Admission: RE | Admit: 2012-04-23 | Discharge: 2012-04-23 | Disposition: A | Discharge status: Home / Self Care | Payer: Medicare Other | Source: Ambulatory Visit | Attending: Family Medicine | Admitting: Family Medicine

## 2012-04-23 DIAGNOSIS — Z1231 Encounter for screening mammogram for malignant neoplasm of breast: Secondary | ICD-10-CM

## 2012-07-29 ENCOUNTER — Other Ambulatory Visit: Payer: Self-pay | Admitting: Family Medicine

## 2012-07-29 DIAGNOSIS — M899 Disorder of bone, unspecified: Secondary | ICD-10-CM

## 2012-08-02 ENCOUNTER — Ambulatory Visit
Admission: RE | Admit: 2012-08-02 | Discharge: 2012-08-02 | Disposition: A | Discharge status: Home / Self Care | Payer: Medicare Other | Source: Ambulatory Visit | Attending: Family Medicine | Admitting: Family Medicine

## 2012-08-02 DIAGNOSIS — M899 Disorder of bone, unspecified: Secondary | ICD-10-CM

## 2012-08-02 DIAGNOSIS — M949 Disorder of cartilage, unspecified: Secondary | ICD-10-CM

## 2012-08-20 ENCOUNTER — Encounter: Payer: Self-pay | Admitting: Physician Assistant

## 2012-08-20 ENCOUNTER — Ambulatory Visit (INDEPENDENT_AMBULATORY_CARE_PROVIDER_SITE_OTHER): Payer: Medicare Other | Admitting: Physician Assistant

## 2012-08-20 VITALS — BP 124/72 | HR 82 | Ht 64.25 in | Wt 172.8 lb

## 2012-08-20 DIAGNOSIS — I10 Essential (primary) hypertension: Secondary | ICD-10-CM

## 2012-08-20 DIAGNOSIS — R079 Chest pain, unspecified: Secondary | ICD-10-CM

## 2012-08-20 MED ORDER — ASPIRIN EC 81 MG PO TBEC: 81.0000 mg | DELAYED_RELEASE_TABLET | Freq: Every day | ORAL | Status: DC

## 2012-08-20 MED ORDER — LISINOPRIL-HYDROCHLOROTHIAZIDE 20-25 MG PO TABS: ORAL_TABLET | ORAL | Status: AC

## 2012-08-20 MED ORDER — METOPROLOL TARTRATE 25 MG PO TABS: 25.0000 mg | ORAL_TABLET | Freq: Two times a day (BID) | ORAL | Status: DC

## 2012-08-20 NOTE — Progress Notes (Signed)
[address], [address] [place], [place]  [address] Phone: [phone] Fax:  [phone]  Date:  08/20/2012   ID:  Cathy Greene, DOB [birth year], MRN [id number]  PCP:  No primary provider on file.  Cardiologist:  Dr. Peter Swaziland     History of Present Illness: Cathy Greene is a 67 y.o. female who returns for the evaluation of chest pain.  She has a hx of HTN and CP.  She is not a diabetic.  No FHx of significant CAD.  She is a non-smoker.  ETT-Myoview 6/12: no ischemia, EF 66%.  Last seen by Dr. Peter Swaziland 09/2010.  She returns today with continued exertional chest tightness for the last 2+ years.  It is not any worse.  It is not any better.  She sometimes has left elbow pain.  She may be slightly short of breath with this.  She has relief with resting.  No rest symptoms.  No assoc nausea, diaphoresis or jaw pain.  No syncope.  No orthopnea, PND, edema.  She notes dyspnea with more extreme activities (NYHA Class II).  Of note, her CP occurs with walking.  She can push her lawn mower (self propelled) without symptoms.    Labs (11/13):  K 3.1, Cr 0.63, Hgb 14 Labs (5/14):    K 3.9, Cr 0.69, ALT 18, LDL 104  Wt Readings from Last 3 Encounters:  08/20/12 172 lb 12.8 oz (78.382 kg)  07/28/11 173 lb (78.472 kg)  07/28/11 173 lb (78.472 kg)     Past Medical History  Diagnosis Date  . Hypertension   . Osteopenia   . Arthritis FINGERS AND KNEES  . Abnormal uterine bleeding   . Rosacea   . Chest pain, exertional     a. ETT-Myoview 6/12: no ischemia, EF 66%    Current Outpatient Prescriptions  Medication Sig Dispense Refill  . Calcium Carb-Cholecalciferol (CALCIUM 500 +D) 500-400 MG-UNIT TABS Take by mouth.      . Cholecalciferol (VITAMIN D PO) Take 2,000 Units by mouth daily.       Marland Kitchen estradiol (VIVELLE-DOT) 0.075 MG/24HR Place 0.5 patches onto the skin once a week.       . ibandronate (BONIVA) 150 MG tablet Take 150 mg by mouth every 30 (thirty) days. Take in the morning  with a full glass of water, on an empty stomach, and do not take anything else by mouth or lie down for the next 30 min.      Marland Kitchen lisinopril-hydrochlorothiazide (PRINZIDE,ZESTORETIC) 20-25 MG per tablet Take 0.5 tablets by mouth daily.       . metroNIDAZOLE (METROCREAM) 0.75 % cream Apply 1 application topically 2 (two) times daily.      . Multiple Vitamins-Minerals (ICAPS) CAPS Take 2 capsules by mouth.      . progesterone (PROMETRIUM) 200 MG capsule Take 200 mg by mouth every 3 (three) months. DAYS 1  -  12       No current facility-administered medications for this visit.    Allergies:    Allergies  Allergen Reactions  . Codeine Other (See Comments)    "HYPER"    Social History:  The patient  reports that she has never smoked. She has never used smokeless tobacco. She reports that she does not drink alcohol or use illicit drugs.   Family History:  The patient's family history includes Arthritis in her mother and sister; Asthma in her sister; Cancer in her father; Diabetes in her maternal grandmother; Hypertension  in her mother and sister; and Lung cancer in her father.  There is no history of Heart attack.   ROS:  Please see the history of present illness.   No belching, indigestion, odynophagia.  She sometimes has pill dysphagia.  No melena, hematochezia.  No pleuritic CP.   No wheezing or cough.  All other systems reviewed and negative.   PHYSICAL EXAM: VS:  BP 124/72  Pulse 82  Ht 5' 4.25" (1.632 m)  Wt 172 lb 12.8 oz (78.382 kg)  BMI 29.43 kg/m2 Well nourished, well developed, in no acute distress HEENT: normal Neck: no JVD Vascular:  No carotid bruits Cardiac:  normal S1, S2; RRR; no murmur Lungs:  clear to auscultation bilaterally, no wheezing, rhonchi or rales Abd: soft, nontender, no hepatomegaly Ext: no edema Skin: warm and dry Neuro:  CNs 2-12 intact, no focal abnormalities noted  EKG:  NSR, HR 82, normal axis     ASSESSMENT AND PLAN:  1. Exertional Chest Pain:   Symptoms suggestive of angina.  She is CCS Class II at worst.  She has had stable symptoms for 2+ years.  There are some activities she can do without production of symptoms.  She has a relative paucity of risk factors aside from HTN, HL.  She has had a normal myoview in the past.  No other identifiable causes noted.  Question if she has some degree of microvascular angina.  BP tends to run low on her current dose of Lisinopril/HCTZ.  I have asked her to hold this and start Metoprolol Tartrate 25 mg QD.  Start ASA 81 mg QD.  Arrange ETT-Myoview to rule out significant ischemia.   2. Hypertension:  Controlled. 3. Hyperlipidemia:  Fair control.  Managed by PCP.  4. Disposition:  F/u with me or Dr. Peter Swaziland in 3-4 weeks.   Signed, Tereso Newcomer, PA-C  08/20/2012 3:37 PM

## 2012-08-20 NOTE — Patient Instructions (Addendum)
HOLD LISINOPRIL/HCTZ UNTIL FURTHER NOTIFIED BY CARDIOLOGY  START METOPROLOL TARTRATE 25 MG 1 TABLET TWICE DAILY, RX SENT IN TODAY  START ASPIRIN 81 MG DAILY  PLEASE SCHEDULE FOR AND EXERCISE MYOVIEW  PLEASE FOLLOW UP WITH SCOTT WEAVER, PAC 3-4 WEEKS SAME DAY DR. Swaziland IS IN THE OFFICE

## 2012-08-28 ENCOUNTER — Ambulatory Visit (HOSPITAL_COMMUNITY): Payer: Medicare Other | Attending: Physician Assistant | Admitting: Radiology

## 2012-08-28 VITALS — BP 129/69 | HR 63 | Ht 64.0 in | Wt 176.0 lb

## 2012-08-28 DIAGNOSIS — I10 Essential (primary) hypertension: Secondary | ICD-10-CM

## 2012-08-28 DIAGNOSIS — E7800 Pure hypercholesterolemia, unspecified: Secondary | ICD-10-CM

## 2012-08-28 DIAGNOSIS — R0602 Shortness of breath: Secondary | ICD-10-CM

## 2012-08-28 DIAGNOSIS — I4949 Other premature depolarization: Secondary | ICD-10-CM

## 2012-08-28 DIAGNOSIS — R0989 Other specified symptoms and signs involving the circulatory and respiratory systems: Secondary | ICD-10-CM | POA: Insufficient documentation

## 2012-08-28 DIAGNOSIS — R079 Chest pain, unspecified: Secondary | ICD-10-CM

## 2012-08-28 DIAGNOSIS — R0609 Other forms of dyspnea: Secondary | ICD-10-CM | POA: Insufficient documentation

## 2012-08-28 DIAGNOSIS — E785 Hyperlipidemia, unspecified: Secondary | ICD-10-CM | POA: Insufficient documentation

## 2012-08-28 DIAGNOSIS — R0789 Other chest pain: Secondary | ICD-10-CM | POA: Insufficient documentation

## 2012-08-28 MED ORDER — TECHNETIUM TC 99M SESTAMIBI GENERIC - CARDIOLITE
11.0000 | Freq: Once | INTRAVENOUS | Status: AC | PRN
  Administered 2012-08-28: 11 via INTRAVENOUS

## 2012-08-28 MED ORDER — TECHNETIUM TC 99M SESTAMIBI GENERIC - CARDIOLITE
33.0000 | Freq: Once | INTRAVENOUS | Status: AC | PRN
  Administered 2012-08-28: 33 via INTRAVENOUS

## 2012-08-28 NOTE — Progress Notes (Signed)
[facility] SITE 3 NUCLEAR MED [address] [place], [place] [address] [phone]    Cardiology Nuclear Med Study  Cathy Greene is a 67 y.o. female     MRN : [id number]     DOB: 25-Sep-1945  Procedure Date: 08/28/2012  Nuclear Med Background Indication for Stress Test:  Evaluation for Ischemia History:  '12 MPS:no ischemia, EF=66% Cardiac Risk Factors: Hypertension and Lipids  Symptoms:  Chest Pain/Tightness>(L) Elbow with Exertion (last episode of chest discomfort was Sunday evening) and DOE   Nuclear Pre-Procedure Caffeine/Decaff Intake:  None NPO After: 6:30 pm   Lungs:  Clear. O2 Sat: 98% on room air. IV 0.9% NS with Angio Cath:  22g  IV Site: R Hand  IV Started by:  Bonnita Levan, RN  Chest Size (in):  34  Cup Size: B  Height: 5\' 4"  (1.626 m)  Weight:  176 lb (79.833 kg)  BMI:  Body mass index is 30.2 kg/(m^2). Tech Comments:  Patient held Metoprolol x 24 hrs    Nuclear Med Study 1 or 2 day study: 1 day  Stress Test Type:  Stress  Reading MD: Marca Ancona, MD  Order Authorizing Provider:  Peter Swaziland, MD  Resting Radionuclide: Technetium 56m Sestamibi  Resting Radionuclide Dose: 11.0 mCi   Stress Radionuclide:  Technetium 60m Sestamibi  Stress Radionuclide Dose: 33.0 mCi           Stress Protocol Rest HR: 63 Stress HR: 153  Rest BP: 129/69 Stress BP: 182/45  Exercise Time (min): 5:34 METS: 7.0   Predicted Max HR: 152 bpm % Max HR: 100.66 bpm Rate Pressure Product: 21308   Dose of Adenosine (mg):  n/a Dose of Lexiscan: n/a mg  Dose of Atropine (mg): n/a Dose of Dobutamine: n/a mcg/kg/min (at max HR)  Stress Test Technologist: Smiley Houseman, CMA-N  Nuclear Technologist:  Domenic Polite, CNMT     Rest Procedure:  Myocardial perfusion imaging was performed at rest 45 minutes following the intravenous administration of Technetium 61m Sestamibi.  Rest ECG: NSR - Normal EKG  Stress Procedure:  The patient exercised on the treadmill utilizing the  Bruce Protocol for 5:34 minutes. The patient stopped due to fatigue and dyspnea.  She denied any chest pain during exercise, but did c/o chest tightness, 2/10,  immediately post exercise.  Technetium 68m Sestamibi was injected at peak exercise and myocardial perfusion imaging was performed after a brief delay.  Stress ECG: No significant ST segment change suggestive of ischemia. and Insignificant upsloping ST segment depression.  QPS Raw Data Images:  Normal; no motion artifact; normal heart/lung ratio. Stress Images:  Normal homogeneous uptake in all areas of the myocardium. Rest Images:  Normal homogeneous uptake in all areas of the myocardium. Subtraction (SDS):  There is no evidence of scar or ischemia. Transient Ischemic Dilatation (Normal <1.22):  1.03 Lung/Heart Ratio (Normal <0.45):  0.36  Quantitative Gated Spect Images QGS EDV:  80 ml QGS ESV:  25 ml  Impression Exercise Capacity:  Fair exercise capacity. BP Response:  Normal blood pressure response. Clinical Symptoms:  Dyspnea, chest tightness in recovery.  ECG Impression:  Insignificant upsloping ST segment depression. Comparison with Prior Nuclear Study: No images to compare  Overall Impression:  Normal stress nuclear study.  There was chest tightness in recovery.   LV Ejection Fraction: 69%.  LV Wall Motion:  NL LV Function; NL Wall Motion  Marca Ancona 08/28/2012

## 2012-08-29 ENCOUNTER — Encounter: Payer: Self-pay | Admitting: Physician Assistant

## 2012-08-30 ENCOUNTER — Telehealth: Payer: Self-pay | Admitting: *Deleted

## 2012-08-30 NOTE — Telephone Encounter (Signed)
lmom normal myoview 

## 2012-08-30 NOTE — Telephone Encounter (Signed)
Message copied by Tarri Fuller on Fri Aug 30, 2012 11:15 AM ------      Message from: [place], [place] T      Created: Thu Aug 29, 2012  4:51 PM       Please inform patient stress test normal.      Tereso Newcomer, PA-C  4:51 PM 08/29/2012 ------

## 2012-09-18 ENCOUNTER — Ambulatory Visit (INDEPENDENT_AMBULATORY_CARE_PROVIDER_SITE_OTHER): Payer: Medicare Other | Admitting: Physician Assistant

## 2012-09-18 ENCOUNTER — Encounter: Payer: Self-pay | Admitting: Physician Assistant

## 2012-09-18 VITALS — BP 135/75 | HR 68 | Ht 65.0 in | Wt 173.0 lb

## 2012-09-18 DIAGNOSIS — E7800 Pure hypercholesterolemia, unspecified: Secondary | ICD-10-CM

## 2012-09-18 DIAGNOSIS — I10 Essential (primary) hypertension: Secondary | ICD-10-CM

## 2012-09-18 DIAGNOSIS — R079 Chest pain, unspecified: Secondary | ICD-10-CM

## 2012-09-18 MED ORDER — OMEPRAZOLE MAGNESIUM 20 MG PO TBEC: DELAYED_RELEASE_TABLET | ORAL | Status: DC

## 2012-09-18 NOTE — Patient Instructions (Addendum)
START PRILOSEC OTC 20 MG DAILY, TAKE 30 MINUTES BEFORE MORNING MEAL.  TAKE 3-4 WEEKS, IF NO BETTER CALL us [phone].  IF SYMPTOMS RETURN AFTER STOPPING PRILOSEC, RESTART AND FOLLOW UP WITH YOUR PRIMARY CARE PHYSICIAN.  FOLLOW UP WITH DR.JORDAN IN 3 MONTHS OR SOONER IF NEEDED.

## 2012-09-18 NOTE — Progress Notes (Signed)
[address] [place], [place]  [address] Phone: [phone] Fax:  [phone]  Date:  09/18/2012   ID:  Cathy Greene, DOB October 15, 1945, MRN [id number]  PCP:  Lupita Raider, MD  Cardiologist:  Dr. Peter Swaziland     History of Present Illness: Cathy Greene is a 67 y.o. female who returns for the evaluation of chest pain.  She has a hx of HTN and CP.  She is not a diabetic.  No FHx of significant CAD.  She is a non-smoker.  ETT-Myoview 6/12: no ischemia, EF 66%.  Last seen by Dr. Peter Swaziland 09/2010.  I saw her recently with c/o's continued exertional chest tightness for the last 2+ years.  It was no worse or better.  She noted possible slight shortness of breath with this.  She had relief with resting.  No rest symptoms.  No assoc nausea, diaphoresis or jaw pain.  No syncope.  No orthopnea, PND, edema.  She notes dyspnea with more extreme activities (NYHA Class II).  Of note, her CP would occur with walking.  However, she can push her lawn mower (self propelled) without symptoms.  I questioned whether or not she may have microvascular angina. I stopped her lisinopril/HCTZ. I asked her start metoprolol tartrate 25 mg twice a day. ETT Myoview was arranged. This was normal. She returns for follow up today.  She has noted no difference in her symptoms.  She tells me that they only occur when she walks.  She always walks after eating dinner.  She has no CP at other times.  She can push mow her yard without CP.  She feels like she may be more SOB.  She may note more CP when she breathes heavy.  No wheeze.  No cough.  She feels it hurts to breathe in the cold air during the winter. No hx of asthma.    Labs (11/13):  K 3.1, Cr 0.63, Hgb 14 Labs (5/14):    K 3.9, Cr 0.69, ALT 18, LDL 104  Wt Readings from Last 3 Encounters:  08/28/12 176 lb (79.833 kg)  08/20/12 172 lb 12.8 oz (78.382 kg)  07/28/11 173 lb (78.472 kg)     Past Medical History  Diagnosis Date  . Hypertension   .  Osteopenia   . Arthritis FINGERS AND KNEES  . Abnormal uterine bleeding   . Rosacea   . Chest pain, exertional     a. ETT-Myoview 6/12: no ischemia, EF 66%;  b.  ETT-Myoview 6/14: No scar or ischemia, EF 69%    Current Outpatient Prescriptions  Medication Sig Dispense Refill  . aspirin EC 81 MG tablet Take 1 tablet (81 mg total) by mouth daily.      . Calcium Carb-Cholecalciferol (CALCIUM 500 +D) 500-400 MG-UNIT TABS Take by mouth.      . Cholecalciferol (VITAMIN D PO) Take 2,000 Units by mouth daily.       Marland Kitchen estradiol (VIVELLE-DOT) 0.075 MG/24HR Place 0.5 patches onto the skin once a week.       . ibandronate (BONIVA) 150 MG tablet Take 150 mg by mouth every 30 (thirty) days. Take in the morning with a full glass of water, on an empty stomach, and do not take anything else by mouth or lie down for the next 30 min.      Marland Kitchen lisinopril-hydrochlorothiazide (PRINZIDE,ZESTORETIC) 20-25 MG per tablet HOLD UNTIL FURTHER NOTIFIED BY CARDIOLOGY      . metoprolol tartrate (LOPRESSOR) 25 MG tablet  Take 1 tablet (25 mg total) by mouth 2 (two) times daily.  60 tablet  11  . metroNIDAZOLE (METROCREAM) 0.75 % cream Apply 1 application topically 2 (two) times daily.      . Multiple Vitamins-Minerals (ICAPS) CAPS Take 2 capsules by mouth.      . progesterone (PROMETRIUM) 200 MG capsule Take 200 mg by mouth every 3 (three) months. DAYS 1  -  12       No current facility-administered medications for this visit.    Allergies:    Allergies  Allergen Reactions  . Codeine Other (See Comments)    "HYPER"    Social History:  The patient  reports that she has never smoked. She has never used smokeless tobacco. She reports that she does not drink alcohol or use illicit drugs.   Family History:  The patient's family history includes Arthritis in her mother and sister; Asthma in her sister; Cancer in her father; Diabetes in her maternal grandmother; Hypertension in her mother and sister; and Lung cancer in her  father.  There is no history of Heart attack.   ROS:  Please see the history of present illness.  All other systems reviewed and negative.   PHYSICAL EXAM: VS:  BP 135/75  Pulse 68  Ht 5\' 5"  (1.651 m)  Wt 173 lb (78.472 kg)  BMI 28.79 kg/m2 Well nourished, well developed, in no acute distress HEENT: normal Neck: no JVD Cardiac:  normal S1, S2; RRR; no murmur Lungs:  clear to auscultation bilaterally, no wheezing, rhonchi or rales Abd: soft, nontender, no hepatomegaly Ext: no edema Skin: warm and dry Neuro:  CNs 2-12 intact, no focal abnormalities noted  EKG:  NSR, HR 68, normal axis     ASSESSMENT AND PLAN:  1. Exertional Chest Pain:  Etiology not clear.  Does not appear to be cardiac.  Low risk myoview.  She has very few RFs.  She notes symptoms all after eating.  I will start Prilosec 20 QD.  She does have some dyspnea as well.  If PPI does not help, refer to pulmonary.  If symptoms improve and return off PPI, consider referral to GI.  If CP worsens, may need to consider invasive cardiac eval.   2. Hypertension:  Controlled.  She would rather take Lisinopril/HCT.  She can stop the metoprolol and restart.   3. Hyperlipidemia:  Fair control.  Managed by PCP.  4. Disposition:  F/u with Dr. Peter Swaziland in 3 mos.   Signed, Tereso Newcomer, PA-C  09/18/2012 11:43 AM

## 2012-11-08 ENCOUNTER — Telehealth: Payer: Self-pay | Admitting: Cardiology

## 2012-11-08 NOTE — Telephone Encounter (Signed)
New Problem  Pt wants to know if she can make a n appt w a pulmonologist vs seeing individual people.

## 2012-11-08 NOTE — Telephone Encounter (Signed)
msg left that DR/ nurse will call her next week to advise.

## 2012-11-12 NOTE — Telephone Encounter (Signed)
Returned call to patient she stated she recently saw Tereso Newcomer PA for chest pain and myoview was normal.Stated she continues to have chest pain when she walks.Stated Prevacid does not help.Dr.Jordan out of office today will check with him tomorrow 11/13/12 and call her back.

## 2012-11-13 NOTE — Telephone Encounter (Signed)
Returned call to patient spoke to Dr.Jordan he advised needs appointment with him or Norma Fredrickson NP to set up a cardiac cath.Appointment was offered for 11/14/12 with Dr.Jordan, but patient unable to come.Advised will call back 11/15/12 to schedule appointment next week with Norma Fredrickson NP.

## 2012-11-15 ENCOUNTER — Ambulatory Visit: Payer: Medicare Other | Admitting: Nurse Practitioner

## 2012-11-15 NOTE — Telephone Encounter (Signed)
Returned call to patient appointment scheduled with Norma Fredrickson NP 11/18/12 at 1:30 pm.

## 2012-11-18 ENCOUNTER — Encounter: Payer: Self-pay | Admitting: Nurse Practitioner

## 2012-11-18 ENCOUNTER — Ambulatory Visit (INDEPENDENT_AMBULATORY_CARE_PROVIDER_SITE_OTHER): Payer: Medicare Other | Admitting: Nurse Practitioner

## 2012-11-18 ENCOUNTER — Ambulatory Visit (INDEPENDENT_AMBULATORY_CARE_PROVIDER_SITE_OTHER)
Admission: RE | Admit: 2012-11-18 | Discharge: 2012-11-18 | Disposition: A | Discharge status: Home / Self Care | Payer: Self-pay | Source: Ambulatory Visit | Attending: Nurse Practitioner | Admitting: Nurse Practitioner

## 2012-11-18 VITALS — BP 110/76 | HR 80 | Ht 66.0 in | Wt 173.8 lb

## 2012-11-18 DIAGNOSIS — R079 Chest pain, unspecified: Secondary | ICD-10-CM

## 2012-11-18 NOTE — Progress Notes (Addendum)
[name] Date of Birth: 1945/12/11 Medical Record #[id number]  History of Present Illness: Cathy Greene is seen back today for a work in visit. Seen for Dr. Swaziland. She has had chronic chest pain, HTN and HLD. Last Myoview was in 2014 and was normal.   Was seen back here in July with chest pain - really no different pattern over the past 2 years. Etiology not felt to be clear. Very few risk factors. Started PPI therapy - plan was to refer to pulmonary if the PPI did not help. If symptoms improved and returned off of PPI then consider GI referral.   Comes back today. Here alone. She is here to get a pulmonary referral. She continues to have this exertional chest pain. Feels like a burning sensation. She has had radiation to the left elbow. It only happens with walking. She avoids the heat and the cold due to symptoms. She does not have any problems mowing, vacuuming, etc. She notes that it is hard to go up inclines. She had poor exercise tolerance with her stress test from June. She notes that while her symptoms have not gotten worse, they have certainly not gotten better. She had no relief with PPI therapy. She prefers a noninvasive approach. Lipids are checked by her PCP. No problems with fatigue.    Current Outpatient Prescriptions  Medication Sig Dispense Refill  . Calcium Carb-Cholecalciferol (CALCIUM 500 +D) 500-400 MG-UNIT TABS Take by mouth.      . Cholecalciferol (VITAMIN D PO) Take 2,000 Units by mouth daily.       Marland Kitchen estradiol (VIVELLE-DOT) 0.075 MG/24HR Place 0.5 patches onto the skin once a week.       . ibandronate (BONIVA) 150 MG tablet Take 150 mg by mouth every 30 (thirty) days. Take in the morning with a full glass of water, on an empty stomach, and do not take anything else by mouth or lie down for the next 30 min.      Marland Kitchen lisinopril-hydrochlorothiazide (PRINZIDE,ZESTORETIC) 20-25 MG per tablet HOLD UNTIL FURTHER NOTIFIED BY CARDIOLOGY      . metroNIDAZOLE (METROCREAM) 0.75 % cream  Apply 1 application topically 2 (two) times daily.      . Multiple Vitamins-Minerals (ICAPS) CAPS Take 2 capsules by mouth.      . progesterone (PROMETRIUM) 200 MG capsule Take 200 mg by mouth every 3 (three) months. DAYS 1  -  12       No current facility-administered medications for this visit.    Allergies  Allergen Reactions  . Codeine Other (See Comments)    "HYPER"    Past Medical History  Diagnosis Date  . Hypertension   . Osteopenia   . Arthritis FINGERS AND KNEES  . Abnormal uterine bleeding   . Rosacea   . Chest pain, exertional     a. ETT-Myoview 6/12: no ischemia, EF 66%;  b.  ETT-Myoview 6/14: No scar or ischemia, EF 69%    Past Surgical History  Procedure Laterality Date  . Cesarean section      X3   LAST ONE 1984  W/ TUBAL LIGATION  . Tonsillectomy and adenoidectomy  AGE 60  . Benign breast bx      History  Smoking status  . Never Smoker   Smokeless tobacco  . Never Used    History  Alcohol Use No    Family History  Problem Relation Age of Onset  . Lung cancer Father   . Hypertension Mother   .  Hypertension Sister   . Arthritis Mother   . Arthritis Sister   . Cancer Father   . Asthma Sister   . Diabetes Maternal Grandmother   . Heart attack Neg Hx     Review of Systems: The review of systems is per the HPI.  All other systems were reviewed and are negative.  Physical Exam: BP 110/76  Pulse 80  Ht 5\' 6"  (1.676 m)  Wt 173 lb 12.8 oz (78.835 kg)  BMI 28.07 kg/m2 Patient is very pleasant and in no acute distress. Skin is warm and dry. Color is normal.  HEENT is unremarkable. Normocephalic/atraumatic. PERRL. Sclera are nonicteric. Neck is supple. No masses. No JVD. Lungs are clear. Cardiac exam shows a regular rate and rhythm. Abdomen is soft. Extremities are without edema. Gait and ROM are intact. No gross neurologic deficits noted.  LABORATORY DATA: EKG today shows sinus rhythm. No acute changes.   Lab Results  Component Value Date    WBC 8.9 01/25/2012   HGB 14.0 01/25/2012   HCT 39.5 01/25/2012   PLT 269 01/25/2012   GLUCOSE 114* 01/25/2012   NA 137 01/25/2012   K 3.1* 01/25/2012   CL 99 01/25/2012   CREATININE 0.63 01/25/2012   BUN 10 01/25/2012   CO2 27 01/25/2012    Assessment / Plan: 1. Chest pain - exertional with radiation to the left elbow. Her symptoms while they have not worsened over the past 2 years, have not improved. I have talked with Dr. Delton See here in the office today regarding cardiac CT with calcium scoring to further define her risk- she is in agreement of this study. Patient is willing to proceed. If this is negative, we will refer on to pulmonary.   2. HLD - monitored by PCP  3. HTN - at control.  Patient is agreeable to this plan and will call if any problems develop in the interim.   Rosalio Macadamia, RN, ANP-C [facility] [address] [place], [place]  [address]   Addendum: Dr. Swaziland has reviewed my treatment planas well - the Calcium score is 0 - she has been referred to pulmonary for further evaluation.

## 2012-11-18 NOTE — Patient Instructions (Addendum)
We are going to arrange for a cardiac CT with calcium scoring to further evaluate your chest pain  Call the [facility] office at [phone] if you have any questions, problems or concerns.

## 2012-11-20 ENCOUNTER — Other Ambulatory Visit: Payer: Self-pay | Admitting: *Deleted

## 2012-11-20 DIAGNOSIS — R079 Chest pain, unspecified: Secondary | ICD-10-CM

## 2012-12-06 ENCOUNTER — Institutional Professional Consult (permissible substitution): Payer: Medicare Other | Admitting: Emergency Medicine

## 2012-12-18 ENCOUNTER — Other Ambulatory Visit: Payer: Medicare Other

## 2012-12-18 ENCOUNTER — Encounter: Payer: Self-pay | Admitting: Emergency Medicine

## 2012-12-18 ENCOUNTER — Ambulatory Visit (INDEPENDENT_AMBULATORY_CARE_PROVIDER_SITE_OTHER): Payer: Medicare Other | Admitting: Emergency Medicine

## 2012-12-18 VITALS — BP 124/70 | HR 76 | Ht 66.0 in | Wt 174.2 lb

## 2012-12-18 DIAGNOSIS — R079 Chest pain, unspecified: Secondary | ICD-10-CM

## 2012-12-18 NOTE — Patient Instructions (Signed)
We will perform full pulmonary function testing at you next office visit Lab work today Follow with Dr Delton Coombes next available with full PFT

## 2012-12-18 NOTE — Assessment & Plan Note (Signed)
Etiology unclear to me. She does sometimes have associated dyspnea but is low risk for obstructive disease. Her fingers have nodular joint changes but I doubt she has RA or any sternal or pleural manifestations of this. Her cardiac CT gives me a pretty good look at much of her lung parenchyma - no apparent abnormalities.  - will check full PFT - will check RF - rov next available

## 2012-12-18 NOTE — Progress Notes (Signed)
Subjective:    Patient ID: Cathy Greene, female    DOB: [birth year], 67 y.o.   MRN: [id number]  HPI 66 yo woman, never smoker, hx of HTN, arthritis. Has had CP with exertion that has been present since about 2012. This has been evaluated by Dr Swaziland and L.Gerhardt. She underwent Myoview 08/2012, showed normal ECG and perfusion images. She describes the pain as mid-sternal, constant pressure, stinging. Sometimes associated with L elbow pain. Seems to happen when she is outside walking. She has been tried on omeprazole 20 qd for 3-4 weeks without improvement. There may be some exertional SOB with heavier activities. She has rare cough, has some allergy sx / sneezing. She has suffered no trauma.    Review of Systems  Constitutional: Negative for fever and unexpected weight change.  HENT: Positive for postnasal drip. Negative for congestion, dental problem, ear pain, nosebleeds, rhinorrhea, sinus pressure, sneezing, sore throat and trouble swallowing.   Eyes: Negative for redness and itching.  Respiratory: Negative for cough, chest tightness, shortness of breath and wheezing.   Cardiovascular: Negative for palpitations and leg swelling.  Gastrointestinal: Negative for nausea and vomiting.  Genitourinary: Negative for dysuria.  Musculoskeletal: Negative for joint swelling.  Skin: Negative for rash.  Neurological: Negative for headaches.  Hematological: Does not bruise/bleed easily.  Psychiatric/Behavioral: Negative for dysphoric mood. The patient is not nervous/anxious.    Past Medical History  Diagnosis Date  . Hypertension   . Osteopenia   . Arthritis FINGERS AND KNEES  . Abnormal uterine bleeding   . Rosacea   . Chest pain, exertional     a. ETT-Myoview 6/12: no ischemia, EF 66%;  b.  ETT-Myoview 6/14: No scar or ischemia, EF 69%     Family History  Problem Relation Age of Onset  . Lung cancer Father   . Hypertension Mother   . Hypertension Sister   . Arthritis Mother   .  Arthritis Sister   . Cancer Father   . Asthma Sister   . Diabetes Maternal Grandmother   . Heart attack Neg Hx      History   Social History  . Marital Status: Married    Spouse Name: N/A    Number of Children: 3  . Years of Education: N/A   Occupational History  . pre school/ medical technology    Social History Main Topics  . Smoking status: Never Smoker   . Smokeless tobacco: Never Used  . Alcohol Use: No  . Drug Use: No  . Sexual Activity: Not on file   Other Topics Concern  . Not on file   Social History Narrative  . No narrative on file     Allergies  Allergen Reactions  . Codeine Other (See Comments)    "HYPER"     Outpatient Prescriptions Prior to Visit  Medication Sig Dispense Refill  . Calcium Carb-Cholecalciferol (CALCIUM 500 +D) 500-400 MG-UNIT TABS Take by mouth.      . Cholecalciferol (VITAMIN D PO) Take 2,000 Units by mouth daily.       Marland Kitchen estradiol (VIVELLE-DOT) 0.075 MG/24HR Place 0.5 patches onto the skin once a week.       . ibandronate (BONIVA) 150 MG tablet Take 150 mg by mouth every 30 (thirty) days. Take in the morning with a full glass of water, on an empty stomach, and do not take anything else by mouth or lie down for the next 30 min.      Marland Kitchen lisinopril-hydrochlorothiazide (PRINZIDE,ZESTORETIC) 20-25 MG  per tablet HOLD UNTIL FURTHER NOTIFIED BY CARDIOLOGY      . Multiple Vitamins-Minerals (ICAPS) CAPS Take 2 capsules by mouth.      . progesterone (PROMETRIUM) 200 MG capsule Take 200 mg by mouth every 3 (three) months. DAYS 1  -  12      . metroNIDAZOLE (METROCREAM) 0.75 % cream Apply 1 application topically 2 (two) times daily.       No facility-administered medications prior to visit.         Objective:   Physical Exam Filed Vitals:   12/18/12 1613  BP: 124/70  Pulse: 76  Height: 5\' 6"  (1.676 m)  Weight: 174 lb 3.2 oz (79.017 kg)  SpO2: 97%   Gen: Pleasant, well-nourished, in no distress,  normal affect  ENT: No lesions,   mouth clear,  oropharynx clear, no postnasal drip  Neck: No JVD, no TMG, no carotid bruits  Lungs: No use of accessory muscles, no dullness to percussion, clear without rales or rhonchi  Cardiovascular: RRR, heart sounds normal, no murmur or gallops, no peripheral edema  Musculoskeletal: No deformities, no cyanosis or clubbing, no chest wall or sternal tenderness, joints of hands with nodular changes that are suggestive of OA > non-tender to palp  Neuro: alert, non focal  Skin: Warm, no lesions or rashes     Assessment & Plan:  Chest pain Etiology unclear to me. She does sometimes have associated dyspnea but is low risk for obstructive disease. Her fingers have nodular joint changes but I doubt she has RA or any sternal or pleural manifestations of this. Her cardiac CT gives me a pretty good look at much of her lung parenchyma - no apparent abnormalities.  - will check full PFT - will check RF - rov next available

## 2012-12-19 LAB — RHEUMATOID FACTOR: Rheumatoid fact SerPl-aCnc: <10 [IU]/mL

## 2012-12-25 ENCOUNTER — Telehealth: Payer: Self-pay | Admitting: Emergency Medicine

## 2012-12-25 NOTE — Telephone Encounter (Signed)
I spoke with pt. I advised her RB is not due back until next week. Please advise regarding results RB thanks

## 2012-12-26 NOTE — Telephone Encounter (Signed)
[email or web address].Jennifer Castillo, CMA  

## 2012-12-26 NOTE — Telephone Encounter (Signed)
Please let her know that her rheumatoid factor is negative. Thanks

## 2012-12-27 NOTE — Telephone Encounter (Signed)
Pt advised. Jennifer Castillo, CMA  

## 2013-01-08 ENCOUNTER — Ambulatory Visit: Payer: Medicare Other | Admitting: Cardiology

## 2013-02-20 ENCOUNTER — Ambulatory Visit: Payer: Medicare Other | Admitting: Emergency Medicine

## 2013-02-21 ENCOUNTER — Encounter (HOSPITAL_COMMUNITY)
Admission: RE | Admit: 2013-02-21 | Discharge: 2013-02-21 | Disposition: A | Discharge status: Home / Self Care | Payer: Medicare Other | Source: Ambulatory Visit | Attending: Emergency Medicine | Admitting: Emergency Medicine

## 2013-02-21 ENCOUNTER — Encounter: Payer: Self-pay | Admitting: Emergency Medicine

## 2013-02-21 ENCOUNTER — Ambulatory Visit (HOSPITAL_COMMUNITY)
Admission: RE | Admit: 2013-02-21 | Discharge: 2013-02-21 | Disposition: A | Discharge status: Home / Self Care | Payer: Medicare Other | Source: Ambulatory Visit | Attending: Emergency Medicine | Admitting: Emergency Medicine

## 2013-02-21 ENCOUNTER — Ambulatory Visit (INDEPENDENT_AMBULATORY_CARE_PROVIDER_SITE_OTHER): Payer: Medicare Other | Admitting: Emergency Medicine

## 2013-02-21 VITALS — BP 122/80 | HR 87 | Temp 98.1°F | Ht 65.0 in | Wt 173.0 lb

## 2013-02-21 DIAGNOSIS — R079 Chest pain, unspecified: Secondary | ICD-10-CM

## 2013-02-21 LAB — PULMONARY FUNCTION TEST
FEF 25-75 Post: 3.81 L/sec
FEF2575-%Change-Post: 10 %
FEF2575-%Pred-Post: 192 %
FEV1-%Pred-Post: 124 %
FEV1-%Pred-Pre: 122 %
FEV1-Post: 2.91 L
FEV1FVC-%Change-Post: 0 %
FEV6-%Change-Post: 1 %
FEV6-Pre: 3.4 L
FVC-%Change-Post: 1 %
FVC-%Pred-Post: 112 %
Post FEV1/FVC ratio: 84 %
Pre FEV1/FVC ratio: 84 %
Pre FEV6/FVC Ratio: 100 %
RV % pred: 106 %
RV: 2.31 L
TLC: 5.83 L

## 2013-02-21 MED ORDER — TECHNETIUM TC 99M DIETHYLENETRIAME-PENTAACETIC ACID: 40.0000 | Freq: Once | INTRAVENOUS | Status: AC | PRN

## 2013-02-21 MED ORDER — TECHNETIUM TO 99M ALBUMIN AGGREGATED
6.0000 | Freq: Once | INTRAVENOUS | Status: AC | PRN
  Administered 2013-02-21: 6 via INTRAVENOUS

## 2013-02-21 NOTE — Addendum Note (Signed)
Addended by: Gwynneth Aliment A on: 02/21/2013 10:55 AM   Modules accepted: Orders

## 2013-02-21 NOTE — Assessment & Plan Note (Signed)
Normal airflows, no clear etiology for her CP. Her DLCO was slightly low.  - will check V/q scan. If this is normal then not sure we will be able to identify this exertional pain. All the same I believe we have ruled out the things that could be dangerous to her - I explained this to her today

## 2013-02-21 NOTE — Progress Notes (Signed)
  Subjective:    Patient ID: Cathy Greene, female    DOB: 05-18-1945, 67 y.o.   MRN: [id number]  HPI 67 yo woman, never smoker, hx of HTN, arthritis. Has had CP with exertion that has been present since about 2012. This has been evaluated by Dr Swaziland and L.Gerhardt. She underwent Myoview 08/2012, showed normal ECG and perfusion images. She describes the pain as mid-sternal, constant pressure, stinging. Sometimes associated with L elbow pain. Seems to happen when she is outside walking. She has been tried on omeprazole 20 qd for 3-4 weeks without improvement. There may be some exertional SOB with heavier activities. She has rare cough, has some allergy sx / sneezing. She has suffered no trauma.   ROV 02/21/13 -- follows up for chest discomfort, some exertional SOB. She underwent PFT today > normal AF, normal volumes, decreased DLCO.  Her RF was negative. She has been walking at a different time of day, the chest discomfort has been less. Seems to relate to the temperature outside.      Review of Systems  Constitutional: Negative for fever and unexpected weight change.  HENT: Positive for postnasal drip. Negative for congestion, dental problem, ear pain, nosebleeds, rhinorrhea, sinus pressure, sneezing, sore throat and trouble swallowing.   Eyes: Negative for redness and itching.  Respiratory: Negative for cough, chest tightness, shortness of breath and wheezing.   Cardiovascular: Negative for palpitations and leg swelling.  Gastrointestinal: Negative for nausea and vomiting.  Genitourinary: Negative for dysuria.  Musculoskeletal: Negative for joint swelling.  Skin: Negative for rash.  Neurological: Negative for headaches.  Hematological: Does not bruise/bleed easily.  Psychiatric/Behavioral: Negative for dysphoric mood. The patient is not nervous/anxious.       Objective:   Physical Exam Filed Vitals:   02/21/13 0957  BP: 122/80  Pulse: 87  Temp: 98.1 F (36.7 C)  TempSrc: Oral   Height: 5\' 5"  (1.651 m)  Weight: 173 lb (78.472 kg)  SpO2: 99%   Gen: Pleasant, well-nourished, in no distress,  normal affect  ENT: No lesions,  mouth clear,  oropharynx clear, no postnasal drip  Neck: No JVD, no TMG, no carotid bruits  Lungs: No use of accessory muscles, no dullness to percussion, clear without rales or rhonchi  Cardiovascular: RRR, heart sounds normal, no murmur or gallops, no peripheral edema  Musculoskeletal: No deformities, no cyanosis or clubbing, no chest wall or sternal tenderness, joints of hands with nodular changes that are suggestive of OA > non-tender to palp  Neuro: alert, non focal  Skin: Warm, no lesions or rashes     Assessment & Plan:  Chest pain Normal airflows, no clear etiology for her CP. Her DLCO was slightly low.  - will check V/q scan. If this is normal then not sure we will be able to identify this exertional pain. All the same I believe we have ruled out the things that could be dangerous to her - I explained this to her today

## 2013-02-21 NOTE — Patient Instructions (Signed)
Your breathing testing was normal with the exception of slightly low oxygen perfusion  We will check a perfusion scan  We will perform walking oximetry today Follow with Dr Delton Coombes if needed.

## 2013-02-21 NOTE — Progress Notes (Signed)
PFT done today. 

## 2013-04-07 ENCOUNTER — Other Ambulatory Visit: Payer: Self-pay

## 2013-04-07 DIAGNOSIS — Z1231 Encounter for screening mammogram for malignant neoplasm of breast: Secondary | ICD-10-CM

## 2013-04-22 ENCOUNTER — Ambulatory Visit
Admission: RE | Admit: 2013-04-22 | Discharge: 2013-04-22 | Disposition: A | Discharge status: Home / Self Care | Payer: Medicare Other | Source: Ambulatory Visit

## 2013-04-22 DIAGNOSIS — Z1231 Encounter for screening mammogram for malignant neoplasm of breast: Secondary | ICD-10-CM

## 2014-03-27 ENCOUNTER — Other Ambulatory Visit: Payer: Self-pay

## 2014-03-27 DIAGNOSIS — Z1231 Encounter for screening mammogram for malignant neoplasm of breast: Secondary | ICD-10-CM

## 2014-04-24 ENCOUNTER — Ambulatory Visit
Admission: RE | Admit: 2014-04-24 | Discharge: 2014-04-24 | Disposition: A | Discharge status: Home / Self Care | Payer: Medicare Other | Source: Ambulatory Visit

## 2014-04-24 DIAGNOSIS — Z1231 Encounter for screening mammogram for malignant neoplasm of breast: Secondary | ICD-10-CM

## 2014-04-30 ENCOUNTER — Ambulatory Visit: Payer: Self-pay | Admitting: Orthopedic Surgery

## 2014-04-30 NOTE — Progress Notes (Signed)
Preoperative surgical orders have been place into the Epic hospital system for Cathy Greene on 04/30/2014, 1:00 PM  by Patrica DuelPERKINS, ALEXZANDREW for surgery on 06-01-2014.  Preop Total Knee orders including Experal, IV Tylenol, and IV Decadron as long as there are no contraindications to the above medications. Avel Peacerew Perkins, PA-C

## 2014-05-21 ENCOUNTER — Other Ambulatory Visit (HOSPITAL_COMMUNITY): Payer: Self-pay | Admitting: *Deleted

## 2014-05-21 NOTE — Patient Instructions (Addendum)
Percival SpanishSusan A Gwaltney  05/21/2014   Your procedure is scheduled on: Monday 06/01/2014  [facility] and follow signs to               Short Stay Center at 1045 AM.  Call this number if you have problems the morning of surgery [phone]   Remember:  Do not eat food  :After Midnight. MAY HAVE CLEAR LIQUIDS FROM MIDNIGHT UP UNTIL 0745 AM THEN NOTHING UNTIL AFTER SURGERY!     Take these medicines the morning of surgery with A SIP OF WATER: no meds to take                               You may not have any metal on your body including hair pins and              piercings  Do not wear jewelry, make-up, lotions, powders or perfumes.             Do not wear nail polish.  Do not shave  48 hours prior to surgery.              Men may shave face and neck.   Do not bring valuables to the hospital. [facility] IS NOT             RESPONSIBLE   FOR VALUABLES.  Contacts, dentures or bridgework may not be worn into surgery.  Leave suitcase in the car. After surgery it may be brought to your room.     Patients discharged the day of surgery will not be allowed to drive home.  Name and phone number of your driver:  Special Instructions: N/A              Please read over the following fact sheets you were given: _____________________________________________________________________             [facility] Pre[facility] Before surgery, you can play an important role.  Because skin is not sterile, your skin needs to be as free of germs as possible.  You can reduce the number of germs on your skin by washing with CHG (chlorahexidine gluconate) soap before surgery.  CHG is an antiseptic cleaner which kills germs and bonds with the skin to continue killing germs even after washing. Please DO NOT use if you have an allergy to CHG or antibacterial soaps.  If your skin becomes reddened/irritated stop using the CHG and inform your nurse when you arrive at  Short Stay. Do not shave (including legs and underarms) for at least 48 hours prior to the first CHG shower.  You may shave your face/neck. Please follow these instructions carefully:  1.  Shower with CHG Soap the night before surgery and the  morning of Surgery.  2.  If you choose to wash your hair, wash your hair first as usual with your  normal  shampoo.  3.  After you shampoo, rinse your hair and body thoroughly to remove the  shampoo.                           4.  Use CHG as you would any other liquid soap.  You can apply chg directly  to the skin and wash  Gently with a scrungie or clean washcloth.  5.  Apply the CHG Soap to your body ONLY FROM THE NECK DOWN.   Do not use on face/ open                           Wound or open sores. Avoid contact with eyes, ears mouth and genitals (private parts).                       Wash face,  Genitals (private parts) with your normal soap.             6.  Wash thoroughly, paying special attention to the area where your surgery  will be performed.  7.  Thoroughly rinse your body with warm water from the neck down.  8.  DO NOT shower/wash with your normal soap after using and rinsing off  the CHG Soap.                9.  Pat yourself dry with a clean towel.            10.  Wear clean pajamas.            11.  Place clean sheets on your bed the night of your first shower and do not  sleep with pets. Day of Surgery : Do not apply any lotions/deodorants the morning of surgery.  Please wear clean clothes to the hospital/surgery center.  FAILURE TO FOLLOW THESE INSTRUCTIONS MAY RESULT IN THE CANCELLATION OF YOUR SURGERY PATIENT SIGNATURE_________________________________  NURSE SIGNATURE__________________________________  ________________________________________________________________________   Adam Phenix  An incentive spirometer is a tool that can help keep your lungs clear and active. This tool measures how well you are  filling your lungs with each breath. Taking long deep breaths may help reverse or decrease the chance of developing breathing (pulmonary) problems (especially infection) following:  A long period of time when you are unable to move or be active. BEFORE THE PROCEDURE   If the spirometer includes an indicator to show your best effort, your nurse or respiratory therapist will set it to a desired goal.  If possible, sit up straight or lean slightly forward. Try not to slouch.  Hold the incentive spirometer in an upright position. INSTRUCTIONS FOR USE   Sit on the edge of your bed if possible, or sit up as far as you can in bed or on a chair.  Hold the incentive spirometer in an upright position.  Breathe out normally.  Place the mouthpiece in your mouth and seal your lips tightly around it.  Breathe in slowly and as deeply as possible, raising the piston or the ball toward the top of the column.  Hold your breath for 3-5 seconds or for as long as possible. Allow the piston or ball to fall to the bottom of the column.  Remove the mouthpiece from your mouth and breathe out normally.  Rest for a few seconds and repeat Steps 1 through 7 at least 10 times every 1-2 hours when you are awake. Take your time and take a few normal breaths between deep breaths.  The spirometer may include an indicator to show your best effort. Use the indicator as a goal to work toward during each repetition.  After each set of 10 deep breaths, practice coughing to be sure your lungs are clear. If you have an incision (the cut made at the time of surgery),  support your incision when coughing by placing a pillow or rolled up towels firmly against it. Once you are able to get out of bed, walk around indoors and cough well. You may stop using the incentive spirometer when instructed by your caregiver.  RISKS AND COMPLICATIONS  Take your time so you do not get dizzy or light-headed.  If you are in pain, you may  need to take or ask for pain medication before doing incentive spirometry. It is harder to take a deep breath if you are having pain. AFTER USE  Rest and breathe slowly and easily.  It can be helpful to keep track of a log of your progress. Your caregiver can provide you with a simple table to help with this. If you are using the spirometer at home, follow these instructions: [place] IF:   You are having difficultly using the spirometer.  You have trouble using the spirometer as often as instructed.  Your pain medication is not giving enough relief while using the spirometer.  You develop fever of 100.5 F (38.1 C) or higher. SEEK IMMEDIATE MEDICAL CARE IF:   You cough up bloody sputum that had not been present before.  You develop fever of 102 F (38.9 C) or greater.  You develop worsening pain at or near the incision site. MAKE SURE YOU:   Understand these instructions.  Will watch your condition.  Will get help right away if you are not doing well or get worse. Document Released: 07/10/2006 Document Revised: 05/22/2011 Document Reviewed: 09/10/2006 ExitCare Patient Information 2014 ExitCare, [place].   ________________________________________________________________________  WHAT IS A BLOOD TRANSFUSION? Blood Transfusion Information  A transfusion is the replacement of blood or some of its parts. Blood is made up of multiple cells which provide different functions.  Red blood cells carry oxygen and are used for blood loss replacement.  White blood cells fight against infection.  Platelets control bleeding.  Plasma helps clot blood.  Other blood products are available for specialized needs, such as hemophilia or other clotting disorders. BEFORE THE TRANSFUSION  Who gives blood for transfusions?   Healthy volunteers who are fully evaluated to make sure their blood is safe. This is blood bank blood. Transfusion therapy is the safest it has ever been in  the practice of medicine. Before blood is taken from a donor, a complete history is taken to make sure that person has no history of diseases nor engages in risky social behavior (examples are intravenous drug use or sexual activity with multiple partners). The donor's travel history is screened to minimize risk of transmitting infections, such as malaria. The donated blood is tested for signs of infectious diseases, such as HIV and hepatitis. The blood is then tested to be sure it is compatible with you in order to minimize the chance of a transfusion reaction. If you or a relative donates blood, this is often done in anticipation of surgery and is not appropriate for emergency situations. It takes many days to process the donated blood. RISKS AND COMPLICATIONS Although transfusion therapy is very safe and saves many lives, the main dangers of transfusion include:   Getting an infectious disease.  Developing a transfusion reaction. This is an allergic reaction to something in the blood you were given. Every precaution is taken to prevent this. The decision to have a blood transfusion has been considered carefully by your caregiver before blood is given. Blood is not given unless the benefits outweigh the risks. AFTER THE TRANSFUSION  Right after receiving a blood transfusion, you will usually feel much better and more energetic. This is especially true if your red blood cells have gotten low (anemic). The transfusion raises the level of the red blood cells which carry oxygen, and this usually causes an energy increase.  The nurse administering the transfusion will monitor you carefully for complications. HOME CARE INSTRUCTIONS  No special instructions are needed after a transfusion. You may find your energy is better. Speak with your caregiver about any limitations on activity for underlying diseases you may have. SEEK MEDICAL CARE IF:   Your condition is not improving after your  transfusion.  You develop redness or irritation at the intravenous (IV) site. SEEK IMMEDIATE MEDICAL CARE IF:  Any of the following symptoms occur over the next 12 hours:  Shaking chills.  You have a temperature by mouth above 102 F (38.9 C), not controlled by medicine.  Chest, back, or muscle pain.  People around you feel you are not acting correctly or are confused.  Shortness of breath or difficulty breathing.  Dizziness and fainting.  You get a rash or develop hives.  You have a decrease in urine output.  Your urine turns a dark color or changes to pink, red, or brown. Any of the following symptoms occur over the next 10 days:  You have a temperature by mouth above 102 F (38.9 C), not controlled by medicine.  Shortness of breath.  Weakness after normal activity.  The white part of the eye turns yellow (jaundice).  You have a decrease in the amount of urine or are urinating less often.  Your urine turns a dark color or changes to pink, red, or brown. Document Released: 02/25/2000 Document Revised: 05/22/2011 Document Reviewed: 10/14/2007 ExitCare Patient Information 2014 ExitCare, [place].  _______________________________________________________________________   CLEAR LIQUID DIET   Foods Allowed                                                                     Foods Excluded  Coffee and tea, regular and decaf                             liquids that you cannot  Plain Jell-O in any flavor                                             see through such as: Fruit ices (not with fruit pulp)                                     milk, soups, orange juice  Iced Popsicles                                    All solid food Carbonated beverages, regular and diet  Cranberry, grape and apple juices Sports drinks like Gatorade Lightly seasoned clear broth or consume(fat free) Sugar, honey syrup  Sample Menu Breakfast                                 Lunch                                     Supper Cranberry juice                    Beef broth                            Chicken broth Jell-O                                     Grape juice                           Apple juice Coffee or tea                        Jell-O                                      Popsicle                                                Coffee or tea                        Coffee or tea  _____________________________________________________________________

## 2014-05-21 NOTE — Progress Notes (Signed)
08/20/2012-Last office note from [facility],[place] for Dr. Peter SwazilandJordan on chart.

## 2014-05-25 ENCOUNTER — Encounter (HOSPITAL_COMMUNITY): Payer: Self-pay

## 2014-05-25 ENCOUNTER — Encounter (HOSPITAL_COMMUNITY)
Admission: RE | Admit: 2014-05-25 | Discharge: 2014-05-25 | Disposition: A | Discharge status: Home / Self Care | Payer: Medicare Other | Source: Ambulatory Visit | Attending: Orthopedic Surgery | Admitting: Orthopedic Surgery

## 2014-05-25 DIAGNOSIS — Z01810 Encounter for preprocedural cardiovascular examination: Secondary | ICD-10-CM | POA: Insufficient documentation

## 2014-05-25 DIAGNOSIS — Z01812 Encounter for preprocedural laboratory examination: Secondary | ICD-10-CM | POA: Diagnosis not present

## 2014-05-25 HISTORY — DX: Personal history of urinary calculi: Z87.442

## 2014-05-25 HISTORY — DX: Cardiac murmur, unspecified: R01.1

## 2014-05-25 LAB — COMPREHENSIVE METABOLIC PANEL
ALBUMIN: 4.3 g/dL (ref 3.5–5.2)
ALT: 22 U/L (ref 0–35)
ANION GAP: 10 (ref 5–15)
AST: 27 U/L (ref 0–37)
Alkaline Phosphatase: 75 U/L (ref 39–117)
BILIRUBIN TOTAL: 0.8 mg/dL (ref 0.3–1.2)
BUN: 10 mg/dL (ref 6–23)
CALCIUM: 9.6 mg/dL (ref 8.4–10.5)
CHLORIDE: 101 mmol/L (ref 96–112)
CO2: 29 mmol/L (ref 19–32)
CREATININE: 0.59 mg/dL (ref 0.50–1.10)
GFR calc Af Amer: >90 mL/min (ref >90)
GFR calc non Af Amer: >90 mL/min (ref >90)
Glucose, Bld: 95 mg/dL (ref 70–99)
Potassium: 3.6 mmol/L (ref 3.5–5.1)
Sodium: 140 mmol/L (ref 135–145)
TOTAL PROTEIN: 7.2 g/dL (ref 6.0–8.3)

## 2014-05-25 LAB — CBC
HCT: 41.5 % (ref 36.0–46.0)
Hemoglobin: 13.6 g/dL (ref 12.0–15.0)
MCH: 29 pg (ref 26.0–34.0)
MCHC: 32.8 g/dL (ref 30.0–36.0)
MCV: 88.5 fL (ref 78.0–100.0)
Platelets: 253 10*3/uL (ref 150–400)
RBC: 4.69 MIL/uL (ref 3.87–5.11)
RDW: 14.1 % (ref 11.5–15.5)
WBC: 6.3 10*3/uL (ref 4.0–10.5)

## 2014-05-25 LAB — PROTIME-INR
INR: 1.03 (ref 0.00–1.49)
Prothrombin Time: 13.7 seconds (ref 11.6–15.2)

## 2014-05-25 LAB — URINALYSIS, ROUTINE W REFLEX MICROSCOPIC
BILIRUBIN URINE: NEGATIVE
Glucose, UA: NEGATIVE mg/dL
HGB URINE DIPSTICK: NEGATIVE
KETONES UR: NEGATIVE mg/dL
LEUKOCYTES UA: NEGATIVE
NITRITE: NEGATIVE
PROTEIN: NEGATIVE mg/dL
Specific Gravity, Urine: 1.005 (ref 1.005–1.030)
Urobilinogen, UA: 0.2 mg/dL (ref 0.0–1.0)
pH: 6.5 (ref 5.0–8.0)

## 2014-05-25 LAB — APTT: aPTT: 27 seconds (ref 24–37)

## 2014-05-25 LAB — SURGICAL PCR SCREEN
MRSA, PCR: NEGATIVE
Staphylococcus aureus: NEGATIVE

## 2014-05-27 NOTE — H&P (Signed)
TOTAL KNEE ADMISSION H&P  Patient is being admitted for left total knee arthroplasty.  Subjective:  Chief Complaint:left knee pain.  HPI: Cathy Greene, 69 y.o. female, has a history of pain and functional disability in the left knee due to arthritis and has failed non-surgical conservative treatments for greater than 12 weeks to includeNSAID's and/or analgesics, corticosteriod injections, viscosupplementation injections and activity modification.  Onset of symptoms was gradual, starting 8 years ago with gradually worsening course since that time. The patient noted no past surgery on the left knee(s).  Patient currently rates pain in the left knee(s) at 8 out of 10 with activity. Patient has night pain, worsening of pain with activity and weight bearing, pain that interferes with activities of daily living, pain with passive range of motion, crepitus and joint swelling.  Patient has evidence of periarticular osteophytes and joint space narrowing by imaging studies.  There is no active infection.  Patient Active Problem List   Diagnosis Date Noted  . Chest pain 08/30/2010  . HTN (hypertension) 08/30/2010  . Hypercholesteremia 08/30/2010   Past Medical History  Diagnosis Date  . Hypertension   . Osteopenia   . Arthritis FINGERS AND KNEES  . Abnormal uterine bleeding   . Rosacea   . Heart murmur   . History of kidney stones dec 2013  . Chest pain, exertional     a. ETT-Myoview 6/12: no ischemia, EF 66%;  b.  ETT-Myoview 6/14: No scar or ischemia, EF 69%  . PONV (postoperative nausea and vomiting) 1979    with laparoscopy    Past Surgical History  Procedure Laterality Date  . Cesarean section      X3   LAST ONE 1984  W/ TUBAL LIGATION  . Tonsillectomy and adenoidectomy  AGE 46  . Benign breast bx    . Uterine fibroid removed  2013  . Laparoscopy  [id number]     Current outpatient prescriptions:  .  Calcium Carb-Cholecalciferol (CALCIUM 500 +D) 500-400 MG-UNIT TABS, Take 1 tablet by  mouth daily. , Disp: , Rfl:  .  Cholecalciferol (VITAMIN D PO), Take 2,000 Units by mouth daily. , Disp: , Rfl:  .  estradiol (VIVELLE-DOT) 0.075 MG/24HR, Place 0.5 patches onto the skin once a week. , Disp: , Rfl:  .  ibandronate (BONIVA) 150 MG tablet, Take 150 mg by mouth every 30 (thirty) days. Take in the morning with a full glass of water, on an empty stomach, and do not take anything else by mouth or lie down for the next 30 min., Disp: , Rfl:  .  lisinopril-hydrochlorothiazide (PRINZIDE,ZESTORETIC) 20-25 MG per tablet, HOLD UNTIL FURTHER NOTIFIED BY CARDIOLOGY (Patient taking differently: Take 0.5 tablets by mouth every morning. ), Disp: , Rfl:  .  Multiple Vitamins-Minerals (ICAPS) CAPS, Take 2 capsules by mouth daily. , Disp: , Rfl:  .  progesterone (PROMETRIUM) 200 MG capsule, Take 200 mg by mouth every 4 (four) months. DAYS 1  -  12, Disp: , Rfl:   Allergies  Allergen Reactions  . Codeine Other (See Comments)    "HYPER"    History  Substance Use Topics  . Smoking status: Never Smoker   . Smokeless tobacco: Never Used  . Alcohol Use: No    Family History  Problem Relation Age of Onset  . Lung cancer Father   . Hypertension Mother   . Hypertension Sister   . Arthritis Mother   . Arthritis Sister   . Cancer Father   . Asthma Sister   .  Diabetes Maternal Grandmother   . Heart attack Neg Hx      Review of Systems  Constitutional: Negative.   HENT: Negative.   Eyes: Negative.   Respiratory: Positive for shortness of breath. Negative for cough, hemoptysis, sputum production and wheezing.        SOB with exertion  Cardiovascular: Negative.   Gastrointestinal: Negative.   Genitourinary: Negative.   Musculoskeletal: Positive for joint pain. Negative for myalgias, back pain, falls and neck pain.       Left knee pain  Skin: Negative.   Neurological: Negative.   Endo/Heme/Allergies: Negative.   Psychiatric/Behavioral: Negative.     Objective:  Physical Exam   Constitutional: She is oriented to person, place, and time. She appears well-developed. No distress.  Overweight  HENT:  Head: Normocephalic and atraumatic.  Right Ear: External ear normal.  Left Ear: External ear normal.  Nose: Nose normal.  Mouth/Throat: Oropharynx is clear and moist.  Eyes: Conjunctivae and EOM are normal.  Neck: Normal range of motion. Neck supple.  Cardiovascular: Normal rate, regular rhythm and intact distal pulses.   Murmur heard.  Systolic murmur is present with a grade of 3/6  Respiratory: Effort normal and breath sounds normal. No respiratory distress. She has no wheezes.  GI: Soft. Bowel sounds are normal. She exhibits no distension. There is no tenderness.  Musculoskeletal:       Right hip: Normal.       Left hip: Normal.       Right knee: Normal.       Left knee: She exhibits decreased range of motion and swelling. She exhibits no effusion and no erythema. Tenderness found. Medial joint line and lateral joint line tenderness noted.  Hips show normal range of motion with no discomfort. Right knee shows no effusion. Range on her right knee is about 0-125. No tenderness or instability. Left knee no effusion. Range is 5-125. Marked crepitus on range of motion. Tenderness medial greater than lateral with no instability noted.  Neurological: She is alert and oriented to person, place, and time. She has normal strength and normal reflexes. No sensory deficit.  Skin: No rash noted. She is not diaphoretic. No erythema.  Psychiatric: She has a normal mood and affect. Her behavior is normal.   Vitals  Weight: 174.6 lb Height: 65in Body Surface Area: 1.87 m Body Mass Index: 29.05 kg/m  Pulse: 80 (Regular)  BP: 118/72 (Sitting, Left Arm, Standard)   Imaging Review Plain radiographs demonstrate severe degenerative joint disease of the left knee(s). The overall alignment ismild varus. The bone quality appears to be good for age and reported activity  level.  Assessment/Plan:  End stage primary osteoarthritis, left knee   The patient history, physical examination, clinical judgment of the provider and imaging studies are consistent with end stage degenerative joint disease of the left knee(s) and total knee arthroplasty is deemed medically necessary. The treatment options including medical management, injection therapy arthroscopy and arthroplasty were discussed at length. The risks and benefits of total knee arthroplasty were presented and reviewed. The risks due to aseptic loosening, infection, stiffness, patella tracking problems, thromboembolic complications and other imponderables were discussed. The patient acknowledged the explanation, agreed to proceed with the plan and consent was signed. Patient is being admitted for inpatient treatment for surgery, pain control, PT, OT, prophylactic antibiotics, VTE prophylaxis, progressive ambulation and ADL's and discharge planning. The patient is planning to be discharged home with home health services (wants [facility])   TXA  IV PCP: Dr. Lupita Raider   Dimitri Ped, PA-C

## 2014-06-01 ENCOUNTER — Inpatient Hospital Stay (HOSPITAL_COMMUNITY): Payer: Medicare Other | Admitting: Anesthesiology

## 2014-06-01 ENCOUNTER — Encounter (HOSPITAL_COMMUNITY): Payer: Self-pay | Admitting: *Deleted

## 2014-06-01 ENCOUNTER — Inpatient Hospital Stay (HOSPITAL_COMMUNITY)
Admission: RE | Admit: 2014-06-01 | Discharge: 2014-06-03 | DRG: 470 | Disposition: A | Discharge status: Home Health | Payer: Medicare Other | Source: Ambulatory Visit | Attending: Orthopedic Surgery | Admitting: Orthopedic Surgery

## 2014-06-01 ENCOUNTER — Encounter (HOSPITAL_COMMUNITY)
Admission: RE | Disposition: A | Discharge status: Home Health | Payer: Self-pay | Source: Ambulatory Visit | Attending: Orthopedic Surgery

## 2014-06-01 DIAGNOSIS — Z79899 Other long term (current) drug therapy: Secondary | ICD-10-CM | POA: Diagnosis not present

## 2014-06-01 DIAGNOSIS — I10 Essential (primary) hypertension: Secondary | ICD-10-CM | POA: Diagnosis present

## 2014-06-01 DIAGNOSIS — Z87442 Personal history of urinary calculi: Secondary | ICD-10-CM | POA: Diagnosis not present

## 2014-06-01 DIAGNOSIS — M8580 Other specified disorders of bone density and structure, unspecified site: Secondary | ICD-10-CM | POA: Diagnosis present

## 2014-06-01 DIAGNOSIS — M25562 Pain in left knee: Secondary | ICD-10-CM | POA: Diagnosis present

## 2014-06-01 DIAGNOSIS — M179 Osteoarthritis of knee, unspecified: Secondary | ICD-10-CM | POA: Diagnosis present

## 2014-06-01 DIAGNOSIS — Z01812 Encounter for preprocedural laboratory examination: Secondary | ICD-10-CM

## 2014-06-01 DIAGNOSIS — M1712 Unilateral primary osteoarthritis, left knee: Secondary | ICD-10-CM | POA: Diagnosis present

## 2014-06-01 DIAGNOSIS — M1710 Unilateral primary osteoarthritis, unspecified knee: Secondary | ICD-10-CM | POA: Diagnosis present

## 2014-06-01 DIAGNOSIS — E780 Pure hypercholesterolemia: Secondary | ICD-10-CM | POA: Diagnosis present

## 2014-06-01 HISTORY — PX: TOTAL KNEE ARTHROPLASTY: SHX125

## 2014-06-01 LAB — TYPE AND SCREEN
ABO/RH(D): O POS
Antibody Screen: NEGATIVE

## 2014-06-01 LAB — ABO/RH: ABO/RH(D): O POS

## 2014-06-01 SURGERY — ARTHROPLASTY, KNEE, TOTAL
Anesthesia: Spinal | Site: Knee | Laterality: Left

## 2014-06-01 MED ORDER — PROPOFOL 10 MG/ML IV BOLUS
INTRAVENOUS | Status: AC
  Filled 2014-06-01: qty 20

## 2014-06-01 MED ORDER — ONDANSETRON HCL 4 MG/2ML IJ SOLN: 4.0000 mg | Freq: Once | INTRAMUSCULAR | Status: DC | PRN

## 2014-06-01 MED ORDER — SODIUM CHLORIDE 0.9 % IR SOLN
Status: DC | PRN
  Administered 2014-06-01: 1000 mL

## 2014-06-01 MED ORDER — CEFAZOLIN SODIUM-DEXTROSE 2-3 GM-% IV SOLR
2.0000 g | Freq: Four times a day (QID) | INTRAVENOUS | Status: AC
  Administered 2014-06-01 (×2): 2 g via INTRAVENOUS
  Filled 2014-06-01 (×2): qty 50

## 2014-06-01 MED ORDER — DEXAMETHASONE SODIUM PHOSPHATE 10 MG/ML IJ SOLN
10.0000 mg | Freq: Once | INTRAMUSCULAR | Status: AC
  Administered 2014-06-02: 10 mg via INTRAVENOUS
  Filled 2014-06-01 (×2): qty 1

## 2014-06-01 MED ORDER — EPHEDRINE SULFATE 50 MG/ML IJ SOLN
INTRAMUSCULAR | Status: AC
  Filled 2014-06-01: qty 1

## 2014-06-01 MED ORDER — FENTANYL CITRATE 0.05 MG/ML IJ SOLN
INTRAMUSCULAR | Status: AC
  Filled 2014-06-01: qty 2

## 2014-06-01 MED ORDER — BUPIVACAINE HCL 0.25 % IJ SOLN
INTRAMUSCULAR | Status: DC | PRN
  Administered 2014-06-01: 20 mL

## 2014-06-01 MED ORDER — METOCLOPRAMIDE HCL 5 MG/ML IJ SOLN: 5.0000 mg | Freq: Three times a day (TID) | INTRAMUSCULAR | Status: DC | PRN

## 2014-06-01 MED ORDER — SODIUM CHLORIDE 0.9 % IJ SOLN
INTRAMUSCULAR | Status: DC | PRN
  Administered 2014-06-01: 30 mL

## 2014-06-01 MED ORDER — BUPIVACAINE LIPOSOME 1.3 % IJ SUSP
20.0000 mL | Freq: Once | INTRAMUSCULAR | Status: DC
  Filled 2014-06-01: qty 20

## 2014-06-01 MED ORDER — BUPIVACAINE LIPOSOME 1.3 % IJ SUSP
INTRAMUSCULAR | Status: DC | PRN
  Administered 2014-06-01: 20 mL

## 2014-06-01 MED ORDER — ONDANSETRON HCL 4 MG PO TABS: 4.0000 mg | ORAL_TABLET | Freq: Four times a day (QID) | ORAL | Status: DC | PRN

## 2014-06-01 MED ORDER — PHENOL 1.4 % MT LIQD
1.0000 | OROMUCOSAL | Status: DC | PRN
  Filled 2014-06-01: qty 177

## 2014-06-01 MED ORDER — HYDROMORPHONE HCL 1 MG/ML IJ SOLN: 0.2500 mg | INTRAMUSCULAR | Status: DC | PRN

## 2014-06-01 MED ORDER — METHOCARBAMOL 1000 MG/10ML IJ SOLN
500.0000 mg | Freq: Four times a day (QID) | INTRAVENOUS | Status: DC | PRN
  Filled 2014-06-01: qty 5

## 2014-06-01 MED ORDER — ONDANSETRON HCL 4 MG/2ML IJ SOLN
4.0000 mg | Freq: Four times a day (QID) | INTRAMUSCULAR | Status: DC | PRN
  Administered 2014-06-02: 4 mg via INTRAVENOUS
  Filled 2014-06-01: qty 2

## 2014-06-01 MED ORDER — RIVAROXABAN 10 MG PO TABS
10.0000 mg | ORAL_TABLET | Freq: Every day | ORAL | Status: DC
  Administered 2014-06-02 – 2014-06-03 (×2): 10 mg via ORAL
  Filled 2014-06-01 (×3): qty 1

## 2014-06-01 MED ORDER — MORPHINE SULFATE 2 MG/ML IJ SOLN: 1.0000 mg | INTRAMUSCULAR | Status: DC | PRN

## 2014-06-01 MED ORDER — MIDAZOLAM HCL 2 MG/2ML IJ SOLN
INTRAMUSCULAR | Status: AC
  Filled 2014-06-01: qty 2

## 2014-06-01 MED ORDER — METHOCARBAMOL 500 MG PO TABS
500.0000 mg | ORAL_TABLET | Freq: Four times a day (QID) | ORAL | Status: DC | PRN
  Administered 2014-06-01 – 2014-06-02 (×3): 500 mg via ORAL
  Filled 2014-06-01 (×3): qty 1

## 2014-06-01 MED ORDER — DIPHENHYDRAMINE HCL 12.5 MG/5ML PO ELIX: 12.5000 mg | ORAL_SOLUTION | ORAL | Status: DC | PRN

## 2014-06-01 MED ORDER — POLYETHYLENE GLYCOL 3350 17 G PO PACK: 17.0000 g | PACK | Freq: Every day | ORAL | Status: DC | PRN

## 2014-06-01 MED ORDER — METOCLOPRAMIDE HCL 5 MG PO TABS
5.0000 mg | ORAL_TABLET | Freq: Three times a day (TID) | ORAL | Status: DC | PRN
  Filled 2014-06-01: qty 2

## 2014-06-01 MED ORDER — MIDAZOLAM HCL 5 MG/5ML IJ SOLN
INTRAMUSCULAR | Status: DC | PRN
  Administered 2014-06-01: 2 mg via INTRAVENOUS

## 2014-06-01 MED ORDER — ONDANSETRON HCL 4 MG/2ML IJ SOLN
INTRAMUSCULAR | Status: AC
  Filled 2014-06-01: qty 2

## 2014-06-01 MED ORDER — PROPOFOL 10 MG/ML IV BOLUS
INTRAVENOUS | Status: DC | PRN
  Administered 2014-06-01: 20 mg via INTRAVENOUS
  Administered 2014-06-01: 10 mg via INTRAVENOUS

## 2014-06-01 MED ORDER — BUPIVACAINE HCL (PF) 0.25 % IJ SOLN
INTRAMUSCULAR | Status: AC
  Filled 2014-06-01: qty 30

## 2014-06-01 MED ORDER — LACTATED RINGERS IV SOLN
INTRAVENOUS | Status: DC | PRN
  Administered 2014-06-01 (×2): via INTRAVENOUS

## 2014-06-01 MED ORDER — DOCUSATE SODIUM 100 MG PO CAPS
100.0000 mg | ORAL_CAPSULE | Freq: Two times a day (BID) | ORAL | Status: DC
  Administered 2014-06-01 – 2014-06-03 (×4): 100 mg via ORAL

## 2014-06-01 MED ORDER — SODIUM CHLORIDE 0.9 % IJ SOLN
INTRAMUSCULAR | Status: AC
  Filled 2014-06-01: qty 50

## 2014-06-01 MED ORDER — SODIUM CHLORIDE 0.9 % IV SOLN
1000.0000 mg | INTRAVENOUS | Status: AC
  Administered 2014-06-01: 1000 mg via INTRAVENOUS
  Filled 2014-06-01: qty 10

## 2014-06-01 MED ORDER — SODIUM CHLORIDE 0.9 % IJ SOLN
INTRAMUSCULAR | Status: AC
  Filled 2014-06-01: qty 10

## 2014-06-01 MED ORDER — PROPOFOL INFUSION 10 MG/ML OPTIME
INTRAVENOUS | Status: DC | PRN
  Administered 2014-06-01: 140 ug/kg/min via INTRAVENOUS

## 2014-06-01 MED ORDER — CEFAZOLIN SODIUM-DEXTROSE 2-3 GM-% IV SOLR
INTRAVENOUS | Status: AC
  Filled 2014-06-01: qty 50

## 2014-06-01 MED ORDER — ACETAMINOPHEN 650 MG RE SUPP: 650.0000 mg | Freq: Four times a day (QID) | RECTAL | Status: DC | PRN

## 2014-06-01 MED ORDER — OXYCODONE HCL 5 MG PO TABS
5.0000 mg | ORAL_TABLET | ORAL | Status: DC | PRN
  Administered 2014-06-01: 5 mg via ORAL
  Administered 2014-06-01 – 2014-06-03 (×6): 10 mg via ORAL
  Filled 2014-06-01 (×6): qty 2
  Filled 2014-06-01: qty 1

## 2014-06-01 MED ORDER — MEPERIDINE HCL 50 MG/ML IJ SOLN: 6.2500 mg | INTRAMUSCULAR | Status: DC | PRN

## 2014-06-01 MED ORDER — ACETAMINOPHEN 500 MG PO TABS
1000.0000 mg | ORAL_TABLET | Freq: Four times a day (QID) | ORAL | Status: AC
  Administered 2014-06-01 – 2014-06-02 (×4): 1000 mg via ORAL
  Filled 2014-06-01 (×4): qty 2

## 2014-06-01 MED ORDER — FLEET ENEMA 7-19 GM/118ML RE ENEM: 1.0000 | ENEMA | Freq: Once | RECTAL | Status: AC | PRN

## 2014-06-01 MED ORDER — DEXAMETHASONE SODIUM PHOSPHATE 10 MG/ML IJ SOLN
INTRAMUSCULAR | Status: AC
  Filled 2014-06-01: qty 1

## 2014-06-01 MED ORDER — DEXAMETHASONE SODIUM PHOSPHATE 10 MG/ML IJ SOLN
10.0000 mg | Freq: Once | INTRAMUSCULAR | Status: AC
  Administered 2014-06-01: 10 mg via INTRAVENOUS

## 2014-06-01 MED ORDER — TRAMADOL HCL 50 MG PO TABS: 50.0000 mg | ORAL_TABLET | Freq: Four times a day (QID) | ORAL | Status: DC | PRN

## 2014-06-01 MED ORDER — MENTHOL 3 MG MT LOZG: 1.0000 | LOZENGE | OROMUCOSAL | Status: DC | PRN

## 2014-06-01 MED ORDER — ACETAMINOPHEN 325 MG PO TABS: 650.0000 mg | ORAL_TABLET | Freq: Four times a day (QID) | ORAL | Status: DC | PRN

## 2014-06-01 MED ORDER — ONDANSETRON HCL 4 MG/2ML IJ SOLN
INTRAMUSCULAR | Status: DC | PRN
  Administered 2014-06-01: 4 mg via INTRAVENOUS

## 2014-06-01 MED ORDER — MORPHINE SULFATE 10 MG/ML IJ SOLN: 1.0000 mg | INTRAMUSCULAR | Status: DC | PRN

## 2014-06-01 MED ORDER — EPHEDRINE SULFATE 50 MG/ML IJ SOLN
INTRAMUSCULAR | Status: DC | PRN
  Administered 2014-06-01 (×2): 5 mg via INTRAVENOUS

## 2014-06-01 MED ORDER — ACETAMINOPHEN 10 MG/ML IV SOLN
1000.0000 mg | Freq: Once | INTRAVENOUS | Status: AC
  Administered 2014-06-01: 1000 mg via INTRAVENOUS
  Filled 2014-06-01: qty 100

## 2014-06-01 MED ORDER — SODIUM CHLORIDE 0.9 % IV SOLN: INTRAVENOUS | Status: DC

## 2014-06-01 MED ORDER — BISACODYL 10 MG RE SUPP: 10.0000 mg | Freq: Every day | RECTAL | Status: DC | PRN

## 2014-06-01 MED ORDER — FENTANYL CITRATE 0.05 MG/ML IJ SOLN
INTRAMUSCULAR | Status: DC | PRN
  Administered 2014-06-01 (×2): 50 ug via INTRAVENOUS

## 2014-06-01 MED ORDER — KCL IN DEXTROSE-NACL 20-5-0.9 MEQ/L-%-% IV SOLN
INTRAVENOUS | Status: DC
  Administered 2014-06-01 – 2014-06-02 (×2): via INTRAVENOUS
  Filled 2014-06-01 (×3): qty 1000

## 2014-06-01 MED ORDER — CEFAZOLIN SODIUM-DEXTROSE 2-3 GM-% IV SOLR
2.0000 g | INTRAVENOUS | Status: AC
  Administered 2014-06-01: 2 g via INTRAVENOUS

## 2014-06-01 SURGICAL SUPPLY — 63 items
BAG DECANTER FOR FLEXI CONT (MISCELLANEOUS) ×2 IMPLANT
BAG ZIPLOCK 12X15 (MISCELLANEOUS) ×2 IMPLANT
BANDAGE ELASTIC 6 VELCRO ST LF (GAUZE/BANDAGES/DRESSINGS) ×2 IMPLANT
BANDAGE ESMARK 6X9 LF (GAUZE/BANDAGES/DRESSINGS) ×1 IMPLANT
BLADE SAG 18X100X1.27 (BLADE) ×2 IMPLANT
BLADE SAW SGTL 11.0X1.19X90.0M (BLADE) ×2 IMPLANT
BNDG ESMARK 6X9 LF (GAUZE/BANDAGES/DRESSINGS) ×2
BOWL SMART MIX CTS (DISPOSABLE) ×2 IMPLANT
CAP KNEE TOTAL 3 SIGMA ×2 IMPLANT
CEMENT HV SMART SET (Cement) ×4 IMPLANT
CUFF TOURN SGL QUICK 34 (TOURNIQUET CUFF) ×1
CUFF TRNQT CYL 34X4X40X1 (TOURNIQUET CUFF) ×1 IMPLANT
DECANTER SPIKE VIAL GLASS SM (MISCELLANEOUS) ×2 IMPLANT
DRAPE EXTREMITY T 121X128X90 (DRAPE) ×2 IMPLANT
DRAPE POUCH INSTRU U-SHP 10X18 (DRAPES) ×2 IMPLANT
DRAPE U-SHAPE 47X51 STRL (DRAPES) ×2 IMPLANT
DRSG ADAPTIC 3X8 NADH LF (GAUZE/BANDAGES/DRESSINGS) ×2 IMPLANT
DRSG PAD ABDOMINAL 8X10 ST (GAUZE/BANDAGES/DRESSINGS) IMPLANT
DURAPREP 26ML APPLICATOR (WOUND CARE) ×2 IMPLANT
ELECT REM PT RETURN 9FT ADLT (ELECTROSURGICAL) ×2
ELECTRODE REM PT RTRN 9FT ADLT (ELECTROSURGICAL) ×1 IMPLANT
EVACUATOR 1/8 PVC DRAIN (DRAIN) ×2 IMPLANT
FACESHIELD WRAPAROUND (MASK) ×10 IMPLANT
GAUZE SPONGE 4X4 12PLY STRL (GAUZE/BANDAGES/DRESSINGS) ×2 IMPLANT
GLOVE BIO SURGEON STRL SZ7.5 (GLOVE) IMPLANT
GLOVE BIO SURGEON STRL SZ8 (GLOVE) ×2 IMPLANT
GLOVE BIOGEL PI IND STRL 6.5 (GLOVE) ×2 IMPLANT
GLOVE BIOGEL PI IND STRL 8 (GLOVE) ×1 IMPLANT
GLOVE BIOGEL PI INDICATOR 6.5 (GLOVE) ×2
GLOVE BIOGEL PI INDICATOR 8 (GLOVE) ×1
GLOVE SURG SS PI 6.5 STRL IVOR (GLOVE) ×4 IMPLANT
GOWN STRL REUS W/ TWL LRG LVL4 (GOWN DISPOSABLE) ×1 IMPLANT
GOWN STRL REUS W/TWL LRG LVL3 (GOWN DISPOSABLE) ×2 IMPLANT
GOWN STRL REUS W/TWL LRG LVL4 (GOWN DISPOSABLE) ×1
GOWN STRL REUS W/TWL XL LVL3 (GOWN DISPOSABLE) IMPLANT
HANDPIECE INTERPULSE COAX TIP (DISPOSABLE) ×1
IMMOBILIZER KNEE 20 (SOFTGOODS) ×2 IMPLANT
IMMOBILIZER KNEE 20 THIGH 36 (SOFTGOODS) IMPLANT
KIT BASIN OR (CUSTOM PROCEDURE TRAY) ×2 IMPLANT
MANIFOLD NEPTUNE II (INSTRUMENTS) ×2 IMPLANT
NDL SAFETY ECLIPSE 18X1.5 (NEEDLE) ×2 IMPLANT
NEEDLE HYPO 18GX1.5 SHARP (NEEDLE) ×2
NS IRRIG 1000ML POUR BTL (IV SOLUTION) ×2 IMPLANT
PACK TOTAL JOINT (CUSTOM PROCEDURE TRAY) ×2 IMPLANT
PAD ABD 8X10 STRL (GAUZE/BANDAGES/DRESSINGS) ×2 IMPLANT
PADDING CAST COTTON 6X4 STRL (CAST SUPPLIES) ×2 IMPLANT
PEN SKIN MARKING BROAD (MISCELLANEOUS) ×2 IMPLANT
POSITIONER SURGICAL ARM (MISCELLANEOUS) ×2 IMPLANT
SET HNDPC FAN SPRY TIP SCT (DISPOSABLE) ×1 IMPLANT
STRIP CLOSURE SKIN 1/2X4 (GAUZE/BANDAGES/DRESSINGS) ×2 IMPLANT
SUCTION FRAZIER 12FR DISP (SUCTIONS) ×2 IMPLANT
SUT MNCRL AB 4-0 PS2 18 (SUTURE) ×2 IMPLANT
SUT VIC AB 2-0 CT1 27 (SUTURE) ×3
SUT VIC AB 2-0 CT1 TAPERPNT 27 (SUTURE) ×3 IMPLANT
SUT VLOC 180 0 24IN GS25 (SUTURE) ×2 IMPLANT
SYR 20CC LL (SYRINGE) ×2 IMPLANT
SYR 50ML LL SCALE MARK (SYRINGE) ×2 IMPLANT
TOWEL OR 17X26 10 PK STRL BLUE (TOWEL DISPOSABLE) ×2 IMPLANT
TOWEL OR NON WOVEN STRL DISP B (DISPOSABLE) ×2 IMPLANT
TRAY FOLEY CATH 14FRSI W/METER (CATHETERS) ×2 IMPLANT
WATER STERILE IRR 1500ML POUR (IV SOLUTION) ×2 IMPLANT
WRAP KNEE MAXI GEL POST OP (GAUZE/BANDAGES/DRESSINGS) ×2 IMPLANT
YANKAUER SUCT BULB TIP 10FT TU (MISCELLANEOUS) ×2 IMPLANT

## 2014-06-01 NOTE — Op Note (Signed)
Pre-operative diagnosis- Osteoarthritis  Left knee(s)  Post-operative diagnosis- Osteoarthritis Left knee(s)  Procedure-  Left  Total Knee Arthroplasty  Surgeon- Gus Rankin. Nishant Schrecengost, MD  Assistant- Avel Peace, PA-C   Anesthesia-  Spinal  EBL-* No blood loss amount entered *   Drains Hemovac  Tourniquet time-  Total Tourniquet Time Documented: Thigh (Left) - 31 minutes Total: Thigh (Left) - 31 minutes     Complications- None  Condition-PACU - hemodynamically stable.   Brief Clinical Note  Cathy Greene is a 69 y.o. year old female with end stage OA of her left knee with progressively worsening pain and dysfunction. She has constant pain, with activity and at rest and significant functional deficits with difficulties even with ADLs. She has had extensive non-op management including analgesics, injections of cortisone and viscosupplements, and home exercise program, but remains in significant pain with significant dysfunction. Radiographs show bone on bone arthritis medial and patelloefmoral. She presents now for left Total Knee Arthroplasty.    Procedure in detail---   The patient is brought into the operating room and positioned supine on the operating table. After successful administration of  Spinal,   a tourniquet is placed high on the  Left thigh(s) and the lower extremity is prepped and draped in the usual sterile fashion. Time out is performed by the operating team and then the  Left lower extremity is wrapped in Esmarch, knee flexed and the tourniquet inflated to 300 mmHg.       A midline incision is made with a ten blade through the subcutaneous tissue to the level of the extensor mechanism. A fresh blade is used to make a medial parapatellar arthrotomy. Soft tissue over the proximal medial tibia is subperiosteally elevated to the joint line with a knife and into the semimembranosus bursa with a Cobb elevator. Soft tissue over the proximal lateral tibia is elevated with attention  being paid to avoiding the patellar tendon on the tibial tubercle. The patella is everted, knee flexed 90 degrees and the ACL and PCL are removed. Findings are bone on bone medial and patellofemoral  With large global osteophytes.      The drill is used to create a starting hole in the distal femur and the canal is thoroughly irrigated with sterile saline to remove the fatty contents. The 5 degree Left  valgus alignment guide is placed into the femoral canal and the distal femoral cutting block is pinned to remove 10 mm off the distal femur. Resection is made with an oscillating saw.      The tibia is subluxed forward and the menisci are removed. The extramedullary alignment guide is placed referencing proximally at the medial aspect of the tibial tubercle and distally along the second metatarsal axis and tibial crest. The block is pinned to remove 2mm off the more deficient medial  side. Resection is made with an oscillating saw. Size 3is the most appropriate size for the tibia and the proximal tibia is prepared with the modular drill and keel punch for that size.      The femoral sizing guide is placed and size 4 is most appropriate. Rotation is marked off the epicondylar axis and confirmed by creating a rectangular flexion gap at 90 degrees. The size 4 cutting block is pinned in this rotation and the anterior, posterior and chamfer cuts are made with the oscillating saw. The intercondylar block is then placed and that cut is made.      Trial size 3 tibial component, trial  size 4 narrow posterior stabilized femur and a 12.5  mm posterior stabilized rotating platform insert trial is placed. Full extension is achieved with excellent varus/valgus and anterior/posterior balance throughout full range of motion. The patella is everted and thickness measured to be 22  mm. Free hand resection is taken to 12 mm, a 38 template is placed, lug holes are drilled, trial patella is placed, and it tracks normally. Osteophytes  are removed off the posterior femur with the trial in place. All trials are removed and the cut bone surfaces prepared with pulsatile lavage. Cement is mixed and once ready for implantation, the size 3 tibial implant, size  4 narrow posterior stabilized femoral component, and the size 38 patella are cemented in place and the patella is held with the clamp. The trial insert is placed and the knee held in full extension. The Exparel (20 ml mixed with 30 ml saline) and .25% Bupivicaine, are injected into the extensor mechanism, posterior capsule, medial and lateral gutters and subcutaneous tissues.  All extruded cement is removed and once the cement is hard the permanent 12.5 mm posterior stabilized rotating platform insert is placed into the tibial tray.      The wound is copiously irrigated with saline solution and the extensor mechanism closed over a hemovac drain with #1 V-loc suture. The tourniquet is released for a total tourniquet time of 32  minutes. Flexion against gravity is 140 degrees and the patella tracks normally. Subcutaneous tissue is closed with 2.0 vicryl and subcuticular with running 4.0 Monocryl. The incision is cleaned and dried and steri-strips and a bulky sterile dressing are applied. The limb is placed into a knee immobilizer and the patient is awakened and transported to recovery in stable condition.      Please note that a surgical assistant was a medical necessity for this procedure in order to perform it in a safe and expeditious manner. Surgical assistant was necessary to retract the ligaments and vital neurovascular structures to prevent injury to them and also necessary for proper positioning of the limb to allow for anatomic placement of the prosthesis.   Gus RankinFrank V. Larosa Rhines, MD    06/01/2014, 9:05 AM

## 2014-06-01 NOTE — Transfer of Care (Signed)
Immediate Anesthesia Transfer of Care Note  Patient: Cathy Greene  Procedure(s) Performed: Procedure(s): LEFT TOTAL KNEE ARTHROPLASTY (Left)  Patient Location: PACU  Anesthesia Type:Spinal  Level of Consciousness: awake, alert  and oriented  Airway & Oxygen Therapy: Patient Spontanous Breathing and Patient connected to face mask oxygen  Post-op Assessment: Report given to RN and Post -op Vital signs reviewed and stable  Post vital signs: Reviewed and stable  Last Vitals:  Filed Vitals:   06/01/14 0613  BP: 140/64  Pulse: 88  Temp: 36.4 C  Resp: 16    Complications: No apparent anesthesia complications

## 2014-06-01 NOTE — Anesthesia Procedure Notes (Signed)
Spinal Patient location during procedure: OR Start time: 06/01/2014 8:10 AM End time: 06/01/2014 8:14 AM Staffing Anesthesiologist: Arta BruceSSEY, KEVIN Performed by: anesthesiologist  Preanesthetic Checklist Completed: patient identified, site marked, surgical consent, pre-op evaluation, timeout performed, IV checked, risks and benefits discussed and monitors and equipment checked Spinal Block Patient position: sitting Prep: Betadine Patient monitoring: heart rate, cardiac monitor, continuous pulse ox and blood pressure Approach: right paramedian Location: L3-4 Injection technique: single-shot Needle Needle type: Sprotte  Needle gauge: 24 G Needle length: 9 cm Needle insertion depth: 7 cm Assessment Sensory level: T6

## 2014-06-01 NOTE — Anesthesia Postprocedure Evaluation (Signed)
Anesthesia Post Note  Patient: Cathy SpanishSusan A Greene  Procedure(s) Performed: Procedure(s) (LRB): LEFT TOTAL KNEE ARTHROPLASTY (Left)  Anesthesia type: general  Patient location: PACU  Post pain: Pain level controlled  Post assessment: Patient's Cardiovascular Status Stable  Last Vitals:  Filed Vitals:   06/01/14 1104  BP: 126/54  Pulse: 74  Temp: 36.5 C  Resp: 18    Post vital signs: Reviewed and stable  Level of consciousness: sedated  Complications: No apparent anesthesia complications

## 2014-06-01 NOTE — Interval H&P Note (Signed)
History and Physical Interval Note:  06/01/2014 7:14 AM  Cathy SpanishSusan A Greene  has presented today for surgery, with the diagnosis of OA LEFT KNEE  The various methods of treatment have been discussed with the patient and family. After consideration of risks, benefits and other options for treatment, the patient has consented to  Procedure(s): LEFT TOTAL KNEE ARTHROPLASTY (Left) as a surgical intervention .  The patient's history has been reviewed, patient examined, no change in status, stable for surgery.  I have reviewed the patient's chart and labs.  Questions were answered to the patient's satisfaction.     Loanne DrillingALUISIO,Xylina Rhoads V

## 2014-06-01 NOTE — Evaluation (Signed)
Physical Therapy Evaluation Patient Details Name: Cathy Greene MRN: [id number] DOB: [birth year] Today's Date: 06/01/2014   History of Present Illness  L TKA  Clinical Impression  Pt is s/p TKA resulting in the deficits listed below (see PT Problem List). Pt ambulated 12' with RW, distance limited by nausea. Good progress expected.  Pt will benefit from skilled PT to increase their independence and safety with mobility to allow discharge to the venue listed below.      Follow Up Recommendations Home health PT    Equipment Recommendations  Rolling walker with 5" wheels    Recommendations for Other Services OT consult     Precautions / Restrictions        Mobility  Bed Mobility Overal bed mobility: Independent                Transfers Overall transfer level: Needs assistance Equipment used: Rolling walker (2 wheeled) Transfers: Sit to/from Stand Sit to Stand: Min assist         General transfer comment: cues for hand placement, assist to rise  Ambulation/Gait Ambulation/Gait assistance: Min guard Ambulation Distance (Feet): 12 Feet Assistive device: Rolling walker (2 wheeled) Gait Pattern/deviations: Step-to pattern   Gait velocity interpretation: Below normal speed for age/gender General Gait Details: distance limited by nausea, cues for sequencing  Stairs            Wheelchair Mobility    Modified Rankin (Stroke Patients Only)       Balance Overall balance assessment: Modified Independent                                           Pertinent Vitals/Pain Pain Assessment: 0-10 Pain Score: 2  Pain Location: L posterior knee with walking Pain Descriptors / Indicators: Sore Pain Intervention(s): Limited activity within patient's tolerance;Ice applied;Monitored during session;Premedicated before session    Home Living Family/patient expects to be discharged to:: Private residence Living Arrangements: Spouse/significant  other Available Help at Discharge: Family;Available 24 hours/day Type of Home: House Home Access: Stairs to enter Entrance Stairs-Rails: None Entrance Stairs-Number of Steps: 3 Home Layout: One level Home Equipment: Cane - single point      Prior Function Level of Independence: Independent               Hand Dominance        Extremity/Trunk Assessment               Lower Extremity Assessment: LLE deficits/detail   LLE Deficits / Details: 0-40* L knee AAROM, SLR 3/5, knee ext -3/5, ankle WNL  Cervical / Trunk Assessment: Normal  Communication   Communication: No difficulties  Cognition Arousal/Alertness: Awake/alert Behavior During Therapy: WFL for tasks assessed/performed Overall Cognitive Status: Within Functional Limits for tasks assessed                      General Comments      Exercises Total Joint Exercises Ankle Circles/Pumps: AROM;Both;10 reps;Supine Quad Sets: AROM;10 reps;Supine Heel Slides: AAROM;Left;10 reps;Supine Long Arc Quad: AROM;Left;5 reps;Seated      Assessment/Plan    PT Assessment Patient needs continued PT services  PT Diagnosis Difficulty walking;Acute pain   PT Problem List Decreased strength;Decreased range of motion;Decreased activity tolerance;Pain;Decreased knowledge of use of DME;Decreased mobility  PT Treatment Interventions DME instruction;Gait training;Stair training;Functional mobility training;Therapeutic activities;Patient/family education;Therapeutic exercise   PT Goals (Current  goals can be found in the Care Plan section) Acute Rehab PT Goals Patient Stated Goal: to walk her dog PT Goal Formulation: With patient/family Time For Goal Achievement: 06/15/14 Potential to Achieve Goals: Good    Frequency 7X/week   Barriers to discharge        Co-evaluation               End of Session Equipment Utilized During Treatment: Gait belt Activity Tolerance: Treatment limited secondary to medical  complications (Comment) (onset of nausea when walking) Patient left: in chair;with call bell/phone within reach;with family/visitor present Nurse Communication: Mobility status         Time: 1310-1340 PT Time Calculation (min) (ACUTE ONLY): 30 min   Charges:   PT Evaluation $Initial PT Evaluation Tier I: 1 Procedure PT Treatments $Gait Training: 8-22 mins   PT G Codes:        Tamala SerUhlenberg, Harlene Petralia Kistler 06/01/2014, 3:45 PM [phone]

## 2014-06-01 NOTE — Progress Notes (Signed)
Utilization review completed.  

## 2014-06-01 NOTE — Anesthesia Preprocedure Evaluation (Signed)
Anesthesia Evaluation  Patient identified by MRN, date of birth, ID band Patient awake    Reviewed: Allergy & Precautions, NPO status , Patient's Chart, lab work & pertinent test results  History of Anesthesia Complications (+) PONV  Airway Mallampati: I  TM Distance: >3 FB Neck ROM: Full    Dental   Pulmonary          Cardiovascular hypertension, Pt. on medications     Neuro/Psych    GI/Hepatic   Endo/Other    Renal/GU      Musculoskeletal   Abdominal   Peds  Hematology   Anesthesia Other Findings   Reproductive/Obstetrics                             Anesthesia Physical Anesthesia Plan  ASA: II  Anesthesia Plan: Spinal   Post-op Pain Management:    Induction: Intravenous  Airway Management Planned: Natural Airway  Additional Equipment:   Intra-op Plan:   Post-operative Plan:   Informed Consent: I have reviewed the patients History and Physical, chart, labs and discussed the procedure including the risks, benefits and alternatives for the proposed anesthesia with the patient or authorized representative who has indicated his/her understanding and acceptance.     Plan Discussed with: CRNA and Surgeon  Anesthesia Plan Comments:         Anesthesia Quick Evaluation

## 2014-06-02 LAB — CBC
HEMATOCRIT: 31.6 % — AB (ref 36.0–46.0)
HEMOGLOBIN: 10.6 g/dL — AB (ref 12.0–15.0)
MCH: 29.4 pg (ref 26.0–34.0)
MCHC: 33.5 g/dL (ref 30.0–36.0)
MCV: 87.5 fL (ref 78.0–100.0)
Platelets: 183 10*3/uL (ref 150–400)
RBC: 3.61 MIL/uL — ABNORMAL LOW (ref 3.87–5.11)
RDW: 14.2 % (ref 11.5–15.5)
WBC: 12.3 10*3/uL — ABNORMAL HIGH (ref 4.0–10.5)

## 2014-06-02 LAB — BASIC METABOLIC PANEL
Anion gap: 7 (ref 5–15)
BUN: 6 mg/dL (ref 6–23)
CALCIUM: 7.9 mg/dL — AB (ref 8.4–10.5)
CO2: 25 mmol/L (ref 19–32)
Chloride: 107 mmol/L (ref 96–112)
Creatinine, Ser: 0.51 mg/dL (ref 0.50–1.10)
GFR calc Af Amer: >90 mL/min (ref >90)
Glucose, Bld: 135 mg/dL — ABNORMAL HIGH (ref 70–99)
Potassium: 3.8 mmol/L (ref 3.5–5.1)
SODIUM: 139 mmol/L (ref 135–145)

## 2014-06-02 NOTE — Progress Notes (Signed)
Physical Therapy Treatment Patient Details Name: Cathy SpanishSusan A Greene MRN: [id number] DOB: 12/23/1945 Today's Date: 06/02/2014    History of Present Illness L TKA    PT Comments    POD # 1 pm session.  [facility]o MAX nausea.  RN called to room.  Assisted off BSC back to bed.    Follow Up Recommendations  Home health PT     Equipment Recommendations  Rolling walker with 5" wheels    Recommendations for Other Services       Precautions / Restrictions Precautions Precautions: Knee Precaution Comments: instructed pt on KI use for amb Restrictions Weight Bearing Restrictions: No LLE Weight Bearing: Weight bearing as tolerated    Mobility  Bed Mobility               General bed mobility comments: Pt OOB in recliner  Transfers Overall transfer level: Needs assistance Equipment used: Rolling walker (2 wheeled) Transfers: Sit to/from Stand Sit to Stand: Min guard;Min assist         General transfer comment: cues for hand placement, assist to rise  Ambulation/Gait Ambulation/Gait assistance: Min guard Ambulation Distance (Feet): 45 Feet Assistive device: Rolling walker (2 wheeled) Gait Pattern/deviations: Step-to pattern;Decreased stance time - left Gait velocity: decreased   General Gait Details: increased distance from day prior but still limited by nausea/fear of nausea   Stairs            Wheelchair Mobility    Modified Rankin (Stroke Patients Only)       Balance                                    Cognition Arousal/Alertness: Awake/alert Behavior During Therapy: WFL for tasks assessed/performed Overall Cognitive Status: Within Functional Limits for tasks assessed                      Exercises      General Comments        Pertinent Vitals/Pain Pain Assessment: 0-10 Pain Score: 3  Pain Location: L knee Pain Descriptors / Indicators:  Aching;Sore Pain Intervention(s): Monitored during session;Premedicated before session;Repositioned;Ice applied    Home Living                      Prior Function            PT Goals (current goals can now be found in the care plan section) Progress towards PT goals: Progressing toward goals    Frequency  7X/week    PT Plan      Co-evaluation             End of Session Equipment Utilized During Treatment: Gait belt Activity Tolerance: Patient limited by fatigue Patient left: in chair;with call bell/phone within reach;with family/visitor present     Time: 1415-1440 PT Time Calculation (min) (ACUTE ONLY): 25 min  Charges:  $Gait Training: 8-22 mins $Therapeutic Activity: 8-22 mins                    G Codes:      Felecia ShellingLori Ermon Sagan  PTA [facility]  Acute  Rehab Pager      [phone]

## 2014-06-02 NOTE — Evaluation (Signed)
Occupational Therapy Evaluation Patient Details Name: Cathy Greene MRN: [id number] DOB: 26-Dec-1945 Today's Date: 06/02/2014    History of Present Illness L TKA   Clinical Impression   This 69 year old female was admitted for the above surgery.  She will benefit from one more OT session in acute to educate on toilet and shower transfers.  Goals are for supervision to min guard.  She will have 24/7 assistance at home    Follow Up Recommendations  No OT follow up    Equipment Recommendations  3 in 1 bedside comode    Recommendations for Other Services       Precautions / Restrictions Precautions Precautions: Knee Required Braces or Orthoses: Knee Immobilizer - Left Restrictions Weight Bearing Restrictions: No      Mobility Bed Mobility Overal bed mobility: Modified Independent             General bed mobility comments: HOB raised  Transfers   Equipment used: Rolling walker (2 wheeled) Transfers: Sit to/from Stand Sit to Stand: Min assist         General transfer comment: cues for hand placement, assist to rise    Balance                                            ADL Overall ADL's : Needs assistance/impaired     Grooming: Oral care;Sitting;Set up   Upper Body Bathing: Set up;Sitting   Lower Body Bathing: Minimal assistance;Sit to/from stand   Upper Body Dressing : Set up;Sitting   Lower Body Dressing: Moderate assistance;Sitting/lateral leans   Toilet Transfer: Minimal assistance;Stand-pivot (to recliner)             General ADL Comments: performed ADL from EOB.  Pt will have 24/7 assistance and is not interested in AE.  Educated on 3:1; will practice on next visit along with shower transfer     Vision     Perception     Praxis      Pertinent Vitals/Pain Pain Score: 2  Pain Location: L knee Pain Descriptors / Indicators: Sore Pain Intervention(s): Limited activity within patient's tolerance;Monitored during  session;Premedicated before session;Repositioned;Ice applied     Hand Dominance     Extremity/Trunk Assessment Upper Extremity Assessment Upper Extremity Assessment: Overall WFL for tasks assessed           Communication Communication Communication: No difficulties   Cognition Arousal/Alertness: Awake/alert Behavior During Therapy: WFL for tasks assessed/performed Overall Cognitive Status: Within Functional Limits for tasks assessed                     General Comments       Exercises       Shoulder Instructions      Home Living Family/patient expects to be discharged to:: Private residence Living Arrangements: Spouse/significant other Available Help at Discharge: Family;Available 24 hours/day               Bathroom Shower/Tub: Producer, television/film/video: Handicapped height                Prior Functioning/Environment Level of Independence: Independent             OT Diagnosis: Generalized weakness   OT Problem List: Decreased strength;Decreased activity tolerance;Decreased knowledge of use of DME or AE;Pain   OT Treatment/Interventions: Self-care/ADL training;DME and/or AE instruction;Patient/family education  OT Goals(Current goals can be found in the care plan section) Acute Rehab OT Goals Patient Stated Goal: to walk her dog OT Goal Formulation: With patient Time For Goal Achievement: 06/09/14 Potential to Achieve Goals: Good ADL Goals Pt Will Transfer to Toilet: with supervision;ambulating;bedside commode Pt Will Perform Toileting - Clothing Manipulation and hygiene: with supervision;sit to/from stand Pt Will Perform Tub/Shower Transfer: Shower transfer;with min guard assist;ambulating;3 in 1  OT Frequency: Min 2X/week   Barriers to D/C:            Co-evaluation              End of Session    Activity Tolerance: Patient tolerated treatment well Patient left: in chair;with call bell/phone within reach    Time: 0756-0824 OT Time Calculation (min): 28 min Charges:  OT General Charges $OT Visit: 1 Procedure OT Evaluation $Initial OT Evaluation Tier I: 1 Procedure OT Treatments $Self Care/Home Management : 8-22 mins G-Codes:    Flora Ratz 06/02/2014, 8:32 AM  Marica OtterMaryellen Juliauna Stueve, OTR/L [phone] 06/02/2014

## 2014-06-02 NOTE — Discharge Instructions (Addendum)
° °Dr. Frank Aluisio °Total Joint Specialist °[facility] °[address] °, [place] [address] °[phone] ° °TOTAL KNEE REPLACEMENT POSTOPERATIVE DIRECTIONS ° ° ° °Knee Rehabilitation, Guidelines Following Surgery  °Results after knee surgery are often greatly improved when you follow the exercise, range of motion and muscle strengthening exercises prescribed by your doctor. Safety measures are also important to protect the knee from further injury. Any time any of these exercises cause you to have increased pain or swelling in your knee joint, decrease the amount until you are comfortable again and slowly increase them. If you have problems or questions, call your caregiver or physical therapist for advice.  ° °HOME CARE INSTRUCTIONS  °Remove items at home which could result in a fall. This includes throw rugs or furniture in walking pathways.  °Continue medications as instructed at time of discharge. °You may have some home medications which will be placed on hold until you complete the course of blood thinner medication.  °You may start showering once you are discharged home but do not submerge the incision under water. Just pat the incision dry and apply a dry gauze dressing on daily. °Walk with walker as instructed.  °You may resume a sexual relationship in one month or when given the OK by  your doctor.  °· Use walker as long as suggested by your caregivers. °· Avoid periods of inactivity such as sitting longer than an hour when not asleep. This helps prevent blood clots.  °You may put full weight on your legs and walk as much as is comfortable.  °You may return to work once you are cleared by your doctor.  °Do not drive a car for 6 weeks or until released by you surgeon.  °· Do not drive while taking narcotics.  °Wear the elastic stockings for three weeks following surgery during the day but you may remove then at night. °Make sure you keep all of your appointments after your  operation with all of your doctors and caregivers. You should call the office at the above phone number and make an appointment for approximately two weeks after the date of your surgery. °Change the dressing daily and reapply a dry dressing each time. °Please pick up a stool softener and laxative for home use as long as you are requiring pain medications. °· ICE to the affected knee every three hours for 30 minutes at a time and then as needed for pain and swelling.  Continue to use ice on the knee for pain and swelling from surgery. You may notice swelling that will progress down to the foot and ankle.  This is normal after surgery.  Elevate the leg when you are not up walking on it.   °It is important for you to complete the blood thinner medication as prescribed by your doctor. °· Continue to use the breathing machine which will help keep your temperature down.  It is common for your temperature to cycle up and down following surgery, especially at night when you are not up moving around and exerting yourself.  The breathing machine keeps your lungs expanded and your temperature down. ° °RANGE OF MOTION AND STRENGTHENING EXERCISES  °Rehabilitation of the knee is important following a knee injury or an operation. After just a few days of immobilization, the muscles of the thigh which control the knee become weakened and shrink (atrophy). Knee exercises are designed to build up the tone and strength of the thigh muscles and to improve knee   motion. Often times heat used for twenty to thirty minutes before working out will loosen up your tissues and help with improving the range of motion but do not use heat for the first two weeks following surgery. These exercises can be done on a training (exercise) mat, on the floor, on a table or on a bed. Use what ever works the best and is most comfortable for you Knee exercises include:  °Leg Lifts - While your knee is still immobilized in a splint or cast, you can do  straight leg raises. Lift the leg to 60 degrees, hold for 3 sec, and slowly lower the leg. Repeat 10-20 times 2-3 times daily. Perform this exercise against resistance later as your knee gets better.  °Quad and Hamstring Sets - Tighten up the muscle on the front of the thigh (Quad) and hold for 5-10 sec. Repeat this 10-20 times hourly. Hamstring sets are done by pushing the foot backward against an object and holding for 5-10 sec. Repeat as with quad sets.  °A rehabilitation program following serious knee injuries can speed recovery and prevent re-injury in the future due to weakened muscles. Contact your doctor or a physical therapist for more information on knee rehabilitation.  ° °SKILLED REHAB INSTRUCTIONS: °If the patient is transferred to a skilled rehab facility following release from the hospital, a list of the current medications will be sent to the facility for the patient to continue.  When discharged from the skilled rehab facility, please have the facility set up the patient's Home Health Physical Therapy prior to being released. Also, the skilled facility will be responsible for providing the patient with their medications at time of release from the facility to include their pain medication, the muscle relaxants, and their blood thinner medication. If the patient is still at the rehab facility at time of the two week follow up appointment, the skilled rehab facility will also need to assist the patient in arranging follow up appointment in our office and any transportation needs. ° °MAKE SURE YOU:  °Understand these instructions.  °Will watch your condition.  °Will get help right away if you are not doing well or get worse.  ° ° °Pick up stool softner and laxative for home use following surgery while on pain medications. °Do not submerge incision under water. °Please use good hand washing techniques while changing dressing each day. °May shower starting three days after surgery. °Please use a clean  towel to pat the incision dry following showers. °Continue to use ice for pain and swelling after surgery. °Do not use any lotions or creams on the incision until instructed by your surgeon. ° °Take Xarelto for two and a half more weeks, then discontinue Xarelto. °Once the patient has completed the blood thinner regimen, then take a Baby 81 mg Aspirin daily for three more weeks. ° °Postoperative Constipation Protocol ° °Constipation - defined medically as fewer than three stools per week and severe constipation as less than one stool per week. ° °One of the most common issues patients have following surgery is constipation.  Even if you have a regular bowel pattern at home, your normal regimen is likely to be disrupted due to multiple reasons following surgery.  Combination of anesthesia, postoperative narcotics, change in appetite and fluid intake all can affect your bowels.  In order to avoid complications following surgery, here are some recommendations in order to help you during your recovery period. ° °Colace (docusate) - Pick up an over-the-counter form of   Colace or another stool softener and take twice a day as long as you are requiring postoperative pain medications.  Take with a full glass of water daily.  If you experience loose stools or diarrhea, hold the colace until you stool forms back up.  If your symptoms do not get better within 1 week or if they get worse, check with your doctor. ° °Dulcolax (bisacodyl) - Pick up over-the-counter and take as directed by the product packaging as needed to assist with the movement of your bowels.  Take with a full glass of water.  Use this product as needed if not relieved by Colace only.  ° °MiraLax (polyethylene glycol) - Pick up over-the-counter to have on hand.  MiraLax is a solution that will increase the amount of water in your bowels to assist with bowel movements.  Take as directed and can mix with a glass of water, juice, soda, coffee, or tea.  Take if you  go more than two days without a movement. °Do not use MiraLax more than once per day. Call your doctor if you are still constipated or irregular after using this medication for 7 days in a row. ° °If you continue to have problems with postoperative constipation, please contact the office for further assistance and recommendations.  If you experience "the worst abdominal pain ever" or develop nausea or vomiting, please contact the office immediatly for further recommendations for treatment. ° ° ° °Information on my medicine - XARELTO® (Rivaroxaban) ° °This medication education was reviewed with me or my healthcare representative as part of my discharge preparation.  The pharmacist that spoke with me during my hospital stay was:  Runyon, Amanda, RPH ° °Why was Xarelto® prescribed for you? °Xarelto® was prescribed for you to reduce the risk of blood clots forming after orthopedic surgery. The medical term for these abnormal blood clots is venous thromboembolism (VTE). ° °What do you need to know about xarelto® ? °Take your Xarelto® ONCE DAILY at the same time every day. °You may take it either with or without food. ° °If you have difficulty swallowing the tablet whole, you may crush it and mix in applesauce just prior to taking your dose. ° °Take Xarelto® exactly as prescribed by your doctor and DO NOT stop taking Xarelto® without talking to the doctor who prescribed the medication.  Stopping without other VTE prevention medication to take the place of Xarelto® may increase your risk of developing a clot. ° °After discharge, you should have regular check-up appointments with your healthcare provider that is prescribing your Xarelto®.   ° °What do you do if you miss a dose? °If you miss a dose, take it as soon as you remember on the same day then continue your regularly scheduled once daily regimen the next day. Do not take two doses of Xarelto® on the same day.  ° °Important Safety Information °A possible side effect  of Xarelto® is bleeding. You should call your healthcare provider right away if you experience any of the following: °? Bleeding from an injury or your nose that does not stop. °? Unusual colored urine (red or dark brown) or unusual colored stools (red or black). °? Unusual bruising for unknown reasons. °? A serious fall or if you hit your head (even if there is no bleeding). ° °Some medicines may interact with Xarelto® and might increase your risk of bleeding while on Xarelto®. To help avoid this, consult your healthcare provider or pharmacist prior to using any new   prescription or non-prescription medications, including herbals, vitamins, non-steroidal anti-inflammatory drugs (NSAIDs) and supplements. ° °This website has more information on Xarelto®: www.xarelto.com. ° ° ° °

## 2014-06-02 NOTE — Care Management Note (Signed)
    Page 1 of 2   06/02/2014     2:46:57 PM CARE MANAGEMENT NOTE 06/02/2014  Patient:  Cathy Greene, Cathy Greene   Account Number:  [id number]  Date Initiated:  06/02/2014  Documentation initiated by:  [facility]  Subjective/Objective Assessment:   adm: LEFT TOTAL KNEE ARTHROPLASTY (Left)     Action/Plan:   discharge planning   Anticipated DC Date:  06/03/2014   Anticipated DC Plan:  [place]  CM consult      [facility] Choice  HOME HEALTH   Choice offered to / List presented to:  C-1 Patient   DME arranged  3-N-1  Vassie Moselle      DME agency  [place] arranged  [place].   Status of service:  Completed, signed off Medicare Important Message given?   (If response is "NO", the following Medicare IM given date fields will be blank) Date Medicare IM given:   Medicare IM given by:   Date Additional Medicare IM given:   Additional Medicare IM given by:    Discharge Disposition:  [place]  Per UR Regulation:    If discussed at Long Length of Stay Meetings, dates discussed:    Comments:  06/02/14 14:30 CM met with pt in room to offer choice of home health agency.  Pt chooses [facility] to render HHPT (NOT [facility]).  Address and contact information verfied by pt. Referral called to [facility].  Change of home health agency sent to [place] rep, Tim (PT ALWAYS GETS CHOICE).  CM called [facility] DME rep, Lecretia to please deliver 3n1 and rolling walker prior to discharge. Referral called to [facility] rep, Kristen for HHPT.  No other CM needs were communicated. Tempie Hoist, BSN, [place].

## 2014-06-02 NOTE — Progress Notes (Signed)
Physical Therapy Treatment Patient Details Name: Cathy Greene MRN: [id number] DOB: 01/11/1946 Today's Date: 06/02/2014    History of Present Illness L TKA    PT Comments    POD # 1 am session.  Pt OOB on recliner feeling "ill".  Assisted with applying KI and instructed on use for amb.  amb a limited distance in hallway.  Assisted to BR then back to recliner to perform TKR TE's followed by ICE.  Follow Up Recommendations  Home health PT     Equipment Recommendations  Rolling walker with 5" wheels    Recommendations for Other Services       Precautions / Restrictions Precautions Precautions: Knee Precaution Comments: instructed pt on KI use for amb Restrictions Weight Bearing Restrictions: No LLE Weight Bearing: Weight bearing as tolerated    Mobility  Bed Mobility               General bed mobility comments: Pt OOB in recliner  Transfers Overall transfer level: Needs assistance Equipment used: Rolling walker (2 wheeled) Transfers: Sit to/from Stand Sit to Stand: Min guard;Min assist         General transfer comment: cues for hand placement, assist to rise  Ambulation/Gait Ambulation/Gait assistance: Min guard Ambulation Distance (Feet): 45 Feet Assistive device: Rolling walker (2 wheeled) Gait Pattern/deviations: Step-to pattern;Decreased stance time - left Gait velocity: decreased   General Gait Details: increased distance from day prior but still limited by nausea/fear of nausea   Stairs            Wheelchair Mobility    Modified Rankin (Stroke Patients Only)       Balance                                    Cognition Arousal/Alertness: Awake/alert Behavior During Therapy: WFL for tasks assessed/performed Overall Cognitive Status: Within Functional Limits for tasks assessed                      Exercises   Total Knee Replacement TE's 10 reps B LE ankle pumps 10 reps towel squeezes 10 reps knee presses 10  reps heel slides  10 reps SAQ's 10 reps SLR's 10 reps ABD Followed by ICE     General Comments        Pertinent Vitals/Pain Pain Assessment: 0-10 Pain Score: 3  Pain Location: L knee Pain Descriptors / Indicators: Aching;Sore Pain Intervention(s): Monitored during session;Premedicated before session;Repositioned;Ice applied    Home Living                      Prior Function            PT Goals (current goals can now be found in the care plan section) Progress towards PT goals: Progressing toward goals    Frequency  7X/week    PT Plan      Co-evaluation             End of Session Equipment Utilized During Treatment: Gait belt Activity Tolerance: Patient limited by fatigue Patient left: in chair;with call bell/phone within reach;with family/visitor present     Time: 0920-1000 PT Time Calculation (min) (ACUTE ONLY): 40 min  Charges:  $Gait Training: 8-22 mins $Therapeutic Exercise: 8-22 mins $Therapeutic Activity: 8-22 mins                    G Codes:  Rica Koyanagi  PTA [facility]  Acute  Rehab Pager      [phone]

## 2014-06-02 NOTE — Progress Notes (Signed)
   Subjective: 1 Day Post-Op Procedure(s) (LRB): LEFT TOTAL KNEE ARTHROPLASTY (Left) Patient reports pain as mild.   Patient seen in rounds with Dr. Lequita HaltAluisio.  Tough night last night.  Not much sleep.  Already doing SLR's. Patient is well, but has had some minor complaints of pain in the knee, requiring pain medications We will start therapy today.  Plan is to go Home after hospital stay.  Objective: Vital signs in last 24 hours: Temp:  [97.5 F (36.4 C)-98 F (36.7 C)] 97.9 F (36.6 C) (03/22 0630) Pulse Rate:  [66-87] 79 (03/22 0630) Resp:  [11-20] 16 (03/22 0630) BP: (106-144)/(44-72) 127/63 mmHg (03/22 0630) SpO2:  [94 %-100 %] 100 % (03/22 0630) FiO2 (%):  [2 %] 2 % (03/21 1104)  Intake/Output from previous day:  Intake/Output Summary (Last 24 hours) at 06/02/14 0855 Last data filed at 06/02/14 0630  Gross per 24 hour  Intake 2911.25 ml  Output   4185 ml  Net -1273.75 ml    Intake/Output this shift: UOP 700 since around [place]  Labs:  Recent Labs  06/02/14 0526  HGB 10.6*    Recent Labs  06/02/14 0526  WBC 12.3*  RBC 3.61*  HCT 31.6*  PLT 183    Recent Labs  06/02/14 0526  NA 139  K 3.8  CL 107  CO2 25  BUN 6  CREATININE 0.51  GLUCOSE 135*  CALCIUM 7.9*   No results for input(s): LABPT, INR in the last 72 hours.  EXAM General - Patient is Alert, Appropriate and Oriented Extremity - Neurovascular intact Sensation intact distally Dressing - dressing C/D/I Motor Function - intact, moving foot and toes well on exam.  Hemovac pulled without difficulty.  Past Medical History  Diagnosis Date  . Hypertension   . Osteopenia   . Arthritis FINGERS AND KNEES  . Abnormal uterine bleeding   . Rosacea   . Heart murmur   . History of kidney stones dec 2013  . Chest pain, exertional     a. ETT-Myoview 6/12: no ischemia, EF 66%;  b.  ETT-Myoview 6/14: No scar or ischemia, EF 69%  . PONV (postoperative nausea and vomiting) 1979    with laparoscopy     Assessment/Plan: 1 Day Post-Op Procedure(s) (LRB): LEFT TOTAL KNEE ARTHROPLASTY (Left) Principal Problem:   OA (osteoarthritis) of knee  Estimated body mass index is 29.15 kg/(m^2) as calculated from the following:   Height as of this encounter: 5\' 5"  (1.651 m).   Weight as of this encounter: 79.465 kg (175 lb 3 oz). Advance diet Up with therapy Plan for discharge tomorrow Discharge home with home health  DVT Prophylaxis - Xarelto Weight-Bearing as tolerated to left leg D/C O2 and Pulse OX and try on Room Air  Cathy Peacerew Tysen Roesler, PA-C Orthopaedic Surgery 06/02/2014, 8:55 AM

## 2014-06-03 LAB — CBC
HCT: 31.8 % — ABNORMAL LOW (ref 36.0–46.0)
Hemoglobin: 10.5 g/dL — ABNORMAL LOW (ref 12.0–15.0)
MCH: 29.5 pg (ref 26.0–34.0)
MCHC: 33 g/dL (ref 30.0–36.0)
MCV: 89.3 fL (ref 78.0–100.0)
Platelets: 183 10*3/uL (ref 150–400)
RBC: 3.56 MIL/uL — ABNORMAL LOW (ref 3.87–5.11)
RDW: 14.7 % (ref 11.5–15.5)
WBC: 13.3 10*3/uL — ABNORMAL HIGH (ref 4.0–10.5)

## 2014-06-03 LAB — BASIC METABOLIC PANEL
ANION GAP: 7 (ref 5–15)
BUN: 10 mg/dL (ref 6–23)
CHLORIDE: 108 mmol/L (ref 96–112)
CO2: 27 mmol/L (ref 19–32)
Calcium: 8.3 mg/dL — ABNORMAL LOW (ref 8.4–10.5)
Creatinine, Ser: 0.54 mg/dL (ref 0.50–1.10)
GFR calc Af Amer: >90 mL/min (ref >90)
GFR calc non Af Amer: >90 mL/min (ref >90)
Glucose, Bld: 118 mg/dL — ABNORMAL HIGH (ref 70–99)
Potassium: 3.5 mmol/L (ref 3.5–5.1)
Sodium: 142 mmol/L (ref 135–145)

## 2014-06-03 MED ORDER — TRAMADOL HCL 50 MG PO TABS: 50.0000 mg | ORAL_TABLET | Freq: Four times a day (QID) | ORAL | Status: DC | PRN

## 2014-06-03 MED ORDER — METHOCARBAMOL 500 MG PO TABS: 500.0000 mg | ORAL_TABLET | Freq: Four times a day (QID) | ORAL | Status: DC | PRN

## 2014-06-03 MED ORDER — ONDANSETRON HCL 4 MG PO TABS: 4.0000 mg | ORAL_TABLET | Freq: Four times a day (QID) | ORAL | Status: DC | PRN

## 2014-06-03 MED ORDER — RIVAROXABAN 10 MG PO TABS: 10.0000 mg | ORAL_TABLET | Freq: Every day | ORAL | Status: DC

## 2014-06-03 MED ORDER — OXYCODONE HCL 5 MG PO TABS: 5.0000 mg | ORAL_TABLET | ORAL | Status: DC | PRN

## 2014-06-03 MED ORDER — HYDROMORPHONE HCL 2 MG PO TABS
2.0000 mg | ORAL_TABLET | ORAL | Status: DC | PRN
  Administered 2014-06-03: 4 mg via ORAL
  Filled 2014-06-03: qty 2

## 2014-06-03 MED ORDER — HYDROMORPHONE HCL 2 MG PO TABS: 2.0000 mg | ORAL_TABLET | ORAL | Status: DC | PRN

## 2014-06-03 NOTE — Progress Notes (Signed)
   Subjective: 2 Days Post-Op Procedure(s) (LRB): LEFT TOTAL KNEE ARTHROPLASTY (Left) Patient reports pain as mild.   Patient seen in rounds with Dr. Lequita HaltAluisio.  Nauseates on the Oxy. Will switch to Dilaudid. Patient is well, but has had some minor complaints of pain in the knee, requiring pain medications Patient is ready to go home later this afternoon after two sessions and to make sure tolerates the Dilaudid.  Objective: Vital signs in last 24 hours: Temp:  [97.3 F (36.3 C)-98.1 F (36.7 C)] 98.1 F (36.7 C) (03/22 2140) Pulse Rate:  [82-112] 112 (03/23 0511) Resp:  [16-20] 20 (03/23 0800) BP: (129-161)/(55-64) 161/59 mmHg (03/23 0511) SpO2:  [97 %-100 %] 100 % (03/23 0800)  Intake/Output from previous day:  Intake/Output Summary (Last 24 hours) at 06/03/14 0926 Last data filed at 06/02/14 2140  Gross per 24 hour  Intake    240 ml  Output   1200 ml  Net   -960 ml     Labs:  Recent Labs  06/02/14 0526 06/03/14 0440  HGB 10.6* 10.5*    Recent Labs  06/02/14 0526 06/03/14 0440  WBC 12.3* 13.3*  RBC 3.61* 3.56*  HCT 31.6* 31.8*  PLT 183 183    Recent Labs  06/02/14 0526 06/03/14 0440  NA 139 142  K 3.8 3.5  CL 107 108  CO2 25 27  BUN 6 10  CREATININE 0.51 0.54  GLUCOSE 135* 118*  CALCIUM 7.9* 8.3*   No results for input(s): LABPT, INR in the last 72 hours.  EXAM: General - Patient is Alert, Appropriate and Oriented Extremity - Neurovascular intact Sensation intact distally Dorsiflexion/Plantar flexion intact Incision - clean, dry, no drainage Motor Function - intact, moving foot and toes well on exam.   Assessment/Plan: 2 Days Post-Op Procedure(s) (LRB): LEFT TOTAL KNEE ARTHROPLASTY (Left) Procedure(s) (LRB): LEFT TOTAL KNEE ARTHROPLASTY (Left) Past Medical History  Diagnosis Date  . Hypertension   . Osteopenia   . Arthritis FINGERS AND KNEES  . Abnormal uterine bleeding   . Rosacea   . Heart murmur   . History of kidney stones dec  2013  . Chest pain, exertional     a. ETT-Myoview 6/12: no ischemia, EF 66%;  b.  ETT-Myoview 6/14: No scar or ischemia, EF 69%  . PONV (postoperative nausea and vomiting) 1979    with laparoscopy   Principal Problem:   OA (osteoarthritis) of knee  Estimated body mass index is 29.15 kg/(m^2) as calculated from the following:   Height as of this encounter: 5\' 5"  (1.651 m).   Weight as of this encounter: 79.465 kg (175 lb 3 oz). Up with therapy Discharge home with home health Diet - Cardiac diet Follow up - in 2 weeks Activity - WBAT Disposition - Home Condition Upon Discharge - Good D/C Meds - See DC Summary DVT Prophylaxis - Xarelto  Avel Peacerew Perkins, PA-C Orthopaedic Surgery 06/03/2014, 9:26 AM

## 2014-06-03 NOTE — Discharge Summary (Signed)
Physician Discharge Summary   Patient ID: Cathy Greene MRN: [id number] DOB/AGE: 70/30/1947 70 y.o.  Admit date: 06/01/2014 Discharge date: 06/03/2014  Primary Diagnosis:  Osteoarthritis Left knee(s)  Admission Diagnoses:  Past Medical History  Diagnosis Date  . Hypertension   . Osteopenia   . Arthritis FINGERS AND KNEES  . Abnormal uterine bleeding   . Rosacea   . Heart murmur   . History of kidney stones dec 2013  . Chest pain, exertional     a. ETT-Myoview 6/12: no ischemia, EF 66%;  b.  ETT-Myoview 6/14: No scar or ischemia, EF 69%  . PONV (postoperative nausea and vomiting) 1979    with laparoscopy   Discharge Diagnoses:   Principal Problem:   OA (osteoarthritis) of knee  Estimated body mass index is 29.15 kg/(m^2) as calculated from the following:   Height as of this encounter: '5\' 5"'  (1.651 m).   Weight as of this encounter: 79.465 kg (175 lb 3 oz).  Procedure:  Procedure(s) (LRB): LEFT TOTAL KNEE ARTHROPLASTY (Left)   Consults: None  HPI: Cathy Greene is a 69 y.o. year old female with end stage OA of her left knee with progressively worsening pain and dysfunction. She has constant pain, with activity and at rest and significant functional deficits with difficulties even with ADLs. She has had extensive non-op management including analgesics, injections of cortisone and viscosupplements, and home exercise program, but remains in significant pain with significant dysfunction. Radiographs show bone on bone arthritis medial and patelloefmoral. She presents now for left Total Knee Arthroplasty.   Laboratory Data: Admission on 06/01/2014, Discharged on 06/03/2014  Component Date Value Ref Range Status  . ABO/RH(D) 06/01/2014 O POS   Final  . Antibody Screen 06/01/2014 NEG   Final  . Sample Expiration 06/01/2014 06/04/2014   Final  . ABO/RH(D) 06/01/2014 O POS   Final  . WBC 06/02/2014 12.3* 4.0 - 10.5 K/uL Final  . RBC 06/02/2014 3.61* 3.87 - 5.11 MIL/uL Final  .  Hemoglobin 06/02/2014 10.6* 12.0 - 15.0 g/dL Final  . HCT 06/02/2014 31.6* 36.0 - 46.0 % Final  . MCV 06/02/2014 87.5  78.0 - 100.0 fL Final  . MCH 06/02/2014 29.4  26.0 - 34.0 pg Final  . MCHC 06/02/2014 33.5  30.0 - 36.0 g/dL Final  . RDW 06/02/2014 14.2  11.5 - 15.5 % Final  . Platelets 06/02/2014 183  150 - 400 K/uL Final  . Sodium 06/02/2014 139  135 - 145 mmol/L Final  . Potassium 06/02/2014 3.8  3.5 - 5.1 mmol/L Final  . Chloride 06/02/2014 107  96 - 112 mmol/L Final  . CO2 06/02/2014 25  19 - 32 mmol/L Final  . Glucose, Bld 06/02/2014 135* 70 - 99 mg/dL Final  . BUN 06/02/2014 6  6 - 23 mg/dL Final  . Creatinine, Ser 06/02/2014 0.51  0.50 - 1.10 mg/dL Final  . Calcium 06/02/2014 7.9* 8.4 - 10.5 mg/dL Final  . GFR calc non Af Amer 06/02/2014 >90  >90 mL/min Final  . GFR calc Af Amer 06/02/2014 >90  >90 mL/min Final   Comment: (NOTE) The eGFR has been calculated using the CKD EPI equation. This calculation has not been validated in all clinical situations. eGFR's persistently <90 mL/min signify possible Chronic Kidney Disease.   . Anion gap 06/02/2014 7  5 - 15 Final  . WBC 06/03/2014 13.3* 4.0 - 10.5 K/uL Final  . RBC 06/03/2014 3.56* 3.87 - 5.11 MIL/uL Final  . Hemoglobin 06/03/2014 10.5*  12.0 - 15.0 g/dL Final  . HCT 06/03/2014 31.8* 36.0 - 46.0 % Final  . MCV 06/03/2014 89.3  78.0 - 100.0 fL Final  . MCH 06/03/2014 29.5  26.0 - 34.0 pg Final  . MCHC 06/03/2014 33.0  30.0 - 36.0 g/dL Final  . RDW 06/03/2014 14.7  11.5 - 15.5 % Final  . Platelets 06/03/2014 183  150 - 400 K/uL Final  . Sodium 06/03/2014 142  135 - 145 mmol/L Final  . Potassium 06/03/2014 3.5  3.5 - 5.1 mmol/L Final  . Chloride 06/03/2014 108  96 - 112 mmol/L Final  . CO2 06/03/2014 27  19 - 32 mmol/L Final  . Glucose, Bld 06/03/2014 118* 70 - 99 mg/dL Final  . BUN 06/03/2014 10  6 - 23 mg/dL Final  . Creatinine, Ser 06/03/2014 0.54  0.50 - 1.10 mg/dL Final  . Calcium 06/03/2014 8.3* 8.4 - 10.5 mg/dL  Final  . GFR calc non Af Amer 06/03/2014 >90  >90 mL/min Final  . GFR calc Af Amer 06/03/2014 >90  >90 mL/min Final   Comment: (NOTE) The eGFR has been calculated using the CKD EPI equation. This calculation has not been validated in all clinical situations. eGFR's persistently <90 mL/min signify possible Chronic Kidney Disease.   [name] gap 06/03/2014 7  5 - 15 Final  Hospital Outpatient Visit on 05/25/2014  Component Date Value Ref Range Status  . aPTT 05/25/2014 27  24 - 37 seconds Final  . WBC 05/25/2014 6.3  4.0 - 10.5 K/uL Final  . RBC 05/25/2014 4.69  3.87 - 5.11 MIL/uL Final  . Hemoglobin 05/25/2014 13.6  12.0 - 15.0 g/dL Final  . HCT 05/25/2014 41.5  36.0 - 46.0 % Final  . MCV 05/25/2014 88.5  78.0 - 100.0 fL Final  . MCH 05/25/2014 29.0  26.0 - 34.0 pg Final  . MCHC 05/25/2014 32.8  30.0 - 36.0 g/dL Final  . RDW 05/25/2014 14.1  11.5 - 15.5 % Final  . Platelets 05/25/2014 253  150 - 400 K/uL Final  . Sodium 05/25/2014 140  135 - 145 mmol/L Final  . Potassium 05/25/2014 3.6  3.5 - 5.1 mmol/L Final  . Chloride 05/25/2014 101  96 - 112 mmol/L Final  . CO2 05/25/2014 29  19 - 32 mmol/L Final  . Glucose, Bld 05/25/2014 95  70 - 99 mg/dL Final  . BUN 05/25/2014 10  6 - 23 mg/dL Final  . Creatinine, Ser 05/25/2014 0.59  0.50 - 1.10 mg/dL Final  . Calcium 05/25/2014 9.6  8.4 - 10.5 mg/dL Final  . Total Protein 05/25/2014 7.2  6.0 - 8.3 g/dL Final  . Albumin 05/25/2014 4.3  3.5 - 5.2 g/dL Final  . AST 05/25/2014 27  0 - 37 U/L Final  . ALT 05/25/2014 22  0 - 35 U/L Final  . Alkaline Phosphatase 05/25/2014 75  39 - 117 U/L Final  . Total Bilirubin 05/25/2014 0.8  0.3 - 1.2 mg/dL Final  . GFR calc non Af Amer 05/25/2014 >90  >90 mL/min Final  . GFR calc Af Amer 05/25/2014 >90  >90 mL/min Final   Comment: (NOTE) The eGFR has been calculated using the CKD EPI equation. This calculation has not been validated in all clinical situations. eGFR's persistently <90 mL/min signify  possible Chronic Kidney Disease.   . Anion gap 05/25/2014 10  5 - 15 Final  . Prothrombin Time 05/25/2014 13.7  11.6 - 15.2 seconds Final  . INR 05/25/2014 1.03  0.00 - 1.49 Final  . Color, Urine 05/25/2014 YELLOW  YELLOW Final  . APPearance 05/25/2014 CLEAR  CLEAR Final  . Specific Gravity, Urine 05/25/2014 1.005  1.005 - 1.030 Final  . pH 05/25/2014 6.5  5.0 - 8.0 Final  . Glucose, UA 05/25/2014 NEGATIVE  NEGATIVE mg/dL Final  . Hgb urine dipstick 05/25/2014 NEGATIVE  NEGATIVE Final  . Bilirubin Urine 05/25/2014 NEGATIVE  NEGATIVE Final  . Ketones, ur 05/25/2014 NEGATIVE  NEGATIVE mg/dL Final  . Protein, ur 05/25/2014 NEGATIVE  NEGATIVE mg/dL Final  . Urobilinogen, UA 05/25/2014 0.2  0.0 - 1.0 mg/dL Final  . Nitrite 05/25/2014 NEGATIVE  NEGATIVE Final  . Leukocytes, UA 05/25/2014 NEGATIVE  NEGATIVE Final   MICROSCOPIC NOT DONE ON URINES WITH NEGATIVE PROTEIN, BLOOD, LEUKOCYTES, NITRITE, OR GLUCOSE <1000 mg/dL.  Marland Kitchen MRSA, PCR 05/25/2014 NEGATIVE  NEGATIVE Final  . Staphylococcus aureus 05/25/2014 NEGATIVE  NEGATIVE Final   Comment:        The Xpert SA Assay (FDA approved for NASAL specimens in patients over 39 years of age), is one component of a comprehensive surveillance program.  Test performance has been validated by [facility] for patients greater than or equal to 64 year old. It is not intended to diagnose infection nor to guide or monitor treatment.      X-Rays:No results found.  EKG: Orders placed or performed during the hospital encounter of 05/25/14  . EKG 12-Lead  . EKG 12-Lead     Hospital Course: Cathy Greene is a 69 y.o. who was admitted to [facility]. They were brought to the operating room on 06/01/2014 and underwent Procedure(s): LEFT TOTAL KNEE ARTHROPLASTY.  Patient tolerated the procedure well and was later transferred to the recovery room and then to the orthopaedic floor for postoperative care.  They were given PO and IV analgesics for pain  control following their surgery.  They were given 24 hours of postoperative antibiotics of  Anti-infectives    Start     Dose/Rate Route Frequency Ordered Stop   06/01/14 1400  ceFAZolin (ANCEF) IVPB 2 g/50 mL premix     2 g 100 mL/hr over 30 Minutes Intravenous Every 6 hours 06/01/14 1038 06/01/14 2027   06/01/14 0628  ceFAZolin (ANCEF) IVPB 2 g/50 mL premix     2 g 100 mL/hr over 30 Minutes Intravenous On call to O.R. 06/01/14 [id number] 06/01/14 0815     and started on DVT prophylaxis in the form of Xarelto.   PT and OT were ordered for total joint protocol.  Discharge planning consulted to help with postop disposition and equipment needs.  Patient had a tough night on the evening of surgery with pain and not much sleep.  They started to get up OOB with therapy on day one. Hemovac drain was pulled without difficulty.  Continued to work with therapy into day two.  Dressing was changed on day two and the incision was healing well. Patient was auseated on the Oxy. Switched to Dilaudid.  Patient was seen in rounds and was ready to go home later that day.  Discharge home with home health Diet - Cardiac diet Follow up - in 2 weeks Activity - WBAT Disposition - Home Condition Upon Discharge - Good D/C Meds - See DC Summary DVT Prophylaxis - Xarelto       Discharge Instructions    Call MD / Call 911    Complete by:  As directed   If you experience chest pain or shortness  of breath, CALL 911 and be transported to the hospital emergency room.  If you develope a fever above 101 F, pus (white drainage) or increased drainage or redness at the wound, or calf pain, call your surgeon's office.     Change dressing    Complete by:  As directed   Change dressing daily with sterile 4 x 4 inch gauze dressing and apply TED hose. Do not submerge the incision under water.     Constipation Prevention    Complete by:  As directed   Drink plenty of fluids.  Prune juice may be helpful.  You may use a stool  softener, such as Colace (over the counter) 100 mg twice a day.  Use MiraLax (over the counter) for constipation as needed.     Diet - low sodium heart healthy    Complete by:  As directed      Discharge instructions    Complete by:  As directed   Pick up stool softner and laxative for home use following surgery while on pain medications. Do not submerge incision under water. Please use good hand washing techniques while changing dressing each day. May shower starting three days after surgery. Please use a clean towel to pat the incision dry following showers. Continue to use ice for pain and swelling after surgery. Do not use any lotions or creams on the incision until instructed by your surgeon.  Take Xarelto for two and a half more weeks, then discontinue Xarelto. Once the patient has completed the blood thinner regimen, then take a Baby 81 mg Aspirin daily for three more weeks.  Postoperative Constipation Protocol  Constipation - defined medically as fewer than three stools per week and severe constipation as less than one stool per week.  One of the most common issues patients have following surgery is constipation.  Even if you have a regular bowel pattern at home, your normal regimen is likely to be disrupted due to multiple reasons following surgery.  Combination of anesthesia, postoperative narcotics, change in appetite and fluid intake all can affect your bowels.  In order to avoid complications following surgery, here are some recommendations in order to help you during your recovery period.  Colace (docusate) - Pick up an over-the-counter form of Colace or another stool softener and take twice a day as long as you are requiring postoperative pain medications.  Take with a full glass of water daily.  If you experience loose stools or diarrhea, hold the colace until you stool forms back up.  If your symptoms do not get better within 1 week or if they get worse, check with your  doctor.  Dulcolax (bisacodyl) - Pick up over-the-counter and take as directed by the product packaging as needed to assist with the movement of your bowels.  Take with a full glass of water.  Use this product as needed if not relieved by Colace only.   MiraLax (polyethylene glycol) - Pick up over-the-counter to have on hand.  MiraLax is a solution that will increase the amount of water in your bowels to assist with bowel movements.  Take as directed and can mix with a glass of water, juice, soda, coffee, or tea.  Take if you go more than two days without a movement. Do not use MiraLax more than once per day. Call your doctor if you are still constipated or irregular after using this medication for 7 days in a row.  If you continue to have problems with postoperative  constipation, please contact the office for further assistance and recommendations.  If you experience "the worst abdominal pain ever" or develop nausea or vomiting, please contact the office immediatly for further recommendations for treatment.     Do not put a pillow under the knee. Place it under the heel.    Complete by:  As directed      Do not sit on low chairs, stoools or toilet seats, as it may be difficult to get up from low surfaces    Complete by:  As directed      Driving restrictions    Complete by:  As directed   No driving until released by the physician.     Increase activity slowly as tolerated    Complete by:  As directed      Lifting restrictions    Complete by:  As directed   No lifting until released by the physician.     Patient may shower    Complete by:  As directed   You may shower without a dressing once there is no drainage.  Do not wash over the wound.  If drainage remains, do not shower until drainage stops.     TED hose    Complete by:  As directed   Use stockings (TED hose) for 3 weeks on both leg(s).  You may remove them at night for sleeping.     Weight bearing as tolerated    Complete by:  As  directed   Laterality:  left  Extremity:  Lower            Medication List    STOP taking these medications        CALCIUM 500 +D 500-400 MG-UNIT Tabs  Generic drug:  Calcium Carb-Cholecalciferol     estradiol 0.075 MG/24HR  Commonly known as:  VIVELLE-DOT     ibandronate 150 MG tablet  Commonly known as:  BONIVA     ICAPS Caps     progesterone 200 MG capsule  Commonly known as:  PROMETRIUM     VITAMIN D PO      TAKE these medications        HYDROmorphone 2 MG tablet  Commonly known as:  DILAUDID  Take 1-2 tablets (2-4 mg total) by mouth every 3 (three) hours as needed for moderate pain or severe pain.  Notes to Patient:  Pain     lisinopril-hydrochlorothiazide 20-25 MG per tablet  Commonly known as:  PRINZIDE,ZESTORETIC  HOLD UNTIL FURTHER NOTIFIED BY CARDIOLOGY     methocarbamol 500 MG tablet  Commonly known as:  ROBAXIN  Take 1 tablet (500 mg total) by mouth every 6 (six) hours as needed for muscle spasms.  Notes to Patient:  Muscle Relaxer     ondansetron 4 MG tablet  Commonly known as:  ZOFRAN  Take 1 tablet (4 mg total) by mouth every 6 (six) hours as needed for nausea.     rivaroxaban 10 MG Tabs tablet  Commonly known as:  XARELTO  - Take 1 tablet (10 mg total) by mouth daily with breakfast. Take Xarelto for two and a half more weeks, then discontinue Xarelto.  - Once the patient has completed the blood thinner regimen, then take a Baby 81 mg Aspirin daily for three more weeks.     traMADol 50 MG tablet  Commonly known as:  ULTRAM  Take 1-2 tablets (50-100 mg total) by mouth every 6 (six) hours as needed (mild pain).  Follow-up Information    Follow up with [facility].   Why:  home health physical therapy and rolling walker and 3n1   Contact information:   [address] [place] [address] [phone]       Follow up with Gearlean Alf, MD. Schedule an appointment as soon as possible for a visit in 2  weeks.   Specialty:  Orthopedic Surgery   Why:  Call office at [phone] to setup appointment with Dr. Wynelle Link in two weeks.   Contact information:   [address] [place] [address] [phone]       Signed: Arlee Muslim, PA-C Orthopaedic Surgery 06/25/2014, 8:17 AM

## 2014-06-03 NOTE — Progress Notes (Signed)
OT Cancellation Note  Patient Details Name: Cathy SpanishSusan A Pirie MRN: [id number] DOB: 03/26/1945   Cancelled Treatment:    Reason Eval/Treat Not Completed: Other (comment)  Pt limited by nausea this am.  If time allows, will check back later.    Ericberto Padget 06/03/2014, 9:25 AM  Marica OtterMaryellen Oviya Ammar, OTR/L [phone] 06/03/2014

## 2014-06-03 NOTE — Progress Notes (Signed)
Physical Therapy Treatment Patient Details Name: Cathy Greene MRN: [id number] DOB: 09/03/1945 Today's Date: 06/03/2014    History of Present Illness L TKA    PT Comments    POD # 2 am session.  Assisted pt OOB to amb in hallway and perform stair training with spouse when pt started to C/o MAX Nausea.  Recliner chair brought to her.  Reported to Dr Gerri SporeAluiso who was present in hallway at that time.  Pt returned to room.  Will return later for another session and work towards D/C to home later in the day.  Follow Up Recommendations  Home health PT     Equipment Recommendations  3in1 (PT)    Recommendations for Other Services       Precautions / Restrictions Precautions Precautions: Knee Precaution Comments: pt able to perform active SLR so instructed to D/C KI Restrictions Weight Bearing Restrictions: No LLE Weight Bearing: Weight bearing as tolerated    Mobility  Bed Mobility Overal bed mobility: Needs Assistance Bed Mobility: Supine to Sit     Supine to sit: Supervision;Min guard     General bed mobility comments: assist L LE only with increased time  Transfers Overall transfer level: Needs assistance Equipment used: Rolling walker (2 wheeled) Transfers: Sit to/from Stand Sit to Stand: Supervision;Min guard         General transfer comment: <25% VC's on safety and hand placement esp with stand to sit  Ambulation/Gait Ambulation/Gait assistance: Supervision;Min guard Ambulation Distance (Feet): 58 Feet Assistive device: Rolling walker (2 wheeled) Gait Pattern/deviations: Step-to pattern;Decreased stance time - left Gait velocity: decreased   General Gait Details: tolerated increased distance however MAX nausea with activity still present.  Reported to Dr Despina HickAlusio who was in hallway at that time.     Stairs Stairs: Yes Stairs assistance: Min guard Stair Management: No rails;Step to pattern;Forwards;With walker   General stair comments: performed 2 small 4  inch steps forward with RW and with spouse present for education on proper tech and safe handling.  Wheelchair Mobility    Modified Rankin (Stroke Patients Only)       Balance                                    Cognition Arousal/Alertness: Awake/alert Behavior During Therapy: WFL for tasks assessed/performed Overall Cognitive Status: Within Functional Limits for tasks assessed                      Exercises      General Comments        Pertinent Vitals/Pain Pain Assessment: 0-10 Pain Score: 6  Pain Location: L knee Pain Descriptors / Indicators: Aching;Sore Pain Intervention(s): Premedicated before session;Monitored during session;Repositioned;Ice applied    Home Living                      Prior Function            PT Goals (current goals can now be found in the care plan section) Progress towards PT goals: Progressing toward goals    Frequency  7X/week    PT Plan      Co-evaluation             End of Session Equipment Utilized During Treatment: Gait belt Activity Tolerance: Other (comment) (session limited by nausea) Patient left: in chair;with call bell/phone within reach;with family/visitor present  Time: 8119-1478 PT Time Calculation (min) (ACUTE ONLY): 27 min  Charges:  $Gait Training: 8-22 mins $Therapeutic Activity: 8-22 mins                    G Codes:      Felecia Shelling  PTA [facility]  Acute  Rehab Pager      [phone]

## 2014-06-03 NOTE — Progress Notes (Signed)
Physical Therapy Treatment Patient Details Name: Cathy SpanishSusan A Greene MRN: [id number] DOB: 01/29/1946 Today's Date: 06/03/2014    History of Present Illness L TKA    PT Comments    POD # 2 pt doing much better with pain med change.  No nausea.  With spouse present instructed on safe handling and practiced stairs.  Instructed on KI for stairs for increased safety.  Instructed on car transfers.  Instructed on use of ICE packs.  Instructed on HEP.  All other questions addressed.  Pt ready for D/C to home.  Follow Up Recommendations  Home health PT     Equipment Recommendations  3in1 (PT);Rolling walker with 5" wheels    Recommendations for Other Services       Precautions / Restrictions Precautions Precautions: Knee Precaution Comments: pt able to perform active SLR so instructed to D/C KI except for stairs Required Braces or Orthoses: Knee Immobilizer - Left Restrictions Weight Bearing Restrictions: No LLE Weight Bearing: Weight bearing as tolerated    Mobility  Bed Mobility               General bed mobility comments: pt OOB in recliner  Transfers Overall transfer level: Needs assistance Equipment used: Rolling walker (2 wheeled) Transfers: Sit to/from Stand Sit to Stand: Supervision         General transfer comment: instructed spouse on safe handling  Ambulation/Gait Ambulation/Gait assistance: Supervision Ambulation Distance (Feet): 25 Feet Assistive device: Rolling walker (2 wheeled) Gait Pattern/deviations: Step-to pattern Gait velocity: decreased   General Gait Details: instructed spouse on safe handling   Stairs Stairs: Yes Stairs assistance: Min guard Stair Management: No rails;Step to pattern;Backwards;With walker Number of Stairs: 2 General stair comments: performed 2 small 4 inch steps forward with RW and with spouse present for education on proper tech and safe handling.  Wheelchair Mobility    Modified Rankin (Stroke Patients Only)        Balance                                    Cognition Arousal/Alertness: Awake/alert Behavior During Therapy: WFL for tasks assessed/performed Overall Cognitive Status: Within Functional Limits for tasks assessed                      Exercises      General Comments        Pertinent Vitals/Pain Pain Assessment: 0-10 Pain Score: 4  Pain Location: L knee Pain Descriptors / Indicators: Aching Pain Intervention(s): Monitored during session;Premedicated before session;Repositioned;Ice applied    Home Living                      Prior Function            PT Goals (current goals can now be found in the care plan section) Progress towards PT goals: Progressing toward goals    Frequency  7X/week    PT Plan      Co-evaluation             End of Session Equipment Utilized During Treatment: Gait belt   Patient left: in chair;with call bell/phone within reach;with family/visitor present     Time: 1525-1550 PT Time Calculation (min) (ACUTE ONLY): 25 min  Charges:  $Gait Training: 8-22 mins $Self Care/Home Management: 8-22  G Codes:      Rica Koyanagi  PTA [facility]  Acute  Rehab Pager      [phone]

## 2014-06-03 NOTE — Progress Notes (Signed)
OT Note:    Pt was nauseous earlier with PT.  Demonstrated and explained shower transfer:  She verbalizes understanding of sequence and placement of 3:1 commode.  Will sign off. [place] Creek[place]ellen Perla E[place], [place] [phone] 06/03/2014

## 2014-08-28 ENCOUNTER — Other Ambulatory Visit: Payer: Self-pay | Admitting: Family Medicine

## 2014-08-28 DIAGNOSIS — M8580 Other specified disorders of bone density and structure, unspecified site: Secondary | ICD-10-CM

## 2014-09-02 ENCOUNTER — Ambulatory Visit
Admission: RE | Admit: 2014-09-02 | Discharge: 2014-09-02 | Disposition: A | Discharge status: Home / Self Care | Payer: Medicare Other | Source: Ambulatory Visit | Attending: Family Medicine | Admitting: Family Medicine

## 2014-09-02 DIAGNOSIS — M8580 Other specified disorders of bone density and structure, unspecified site: Secondary | ICD-10-CM

## 2015-04-09 ENCOUNTER — Other Ambulatory Visit: Payer: Self-pay

## 2015-04-09 DIAGNOSIS — Z1231 Encounter for screening mammogram for malignant neoplasm of breast: Secondary | ICD-10-CM

## 2015-05-03 ENCOUNTER — Ambulatory Visit
Admission: RE | Admit: 2015-05-03 | Discharge: 2015-05-03 | Disposition: A | Discharge status: Home / Self Care | Payer: Medicare Other | Source: Ambulatory Visit

## 2015-05-03 DIAGNOSIS — Z1231 Encounter for screening mammogram for malignant neoplasm of breast: Secondary | ICD-10-CM | POA: Diagnosis not present

## 2015-05-27 DIAGNOSIS — Z96652 Presence of left artificial knee joint: Secondary | ICD-10-CM | POA: Diagnosis not present

## 2015-05-27 DIAGNOSIS — Z471 Aftercare following joint replacement surgery: Secondary | ICD-10-CM | POA: Diagnosis not present

## 2015-06-01 DIAGNOSIS — H353222 Exudative age-related macular degeneration, left eye, with inactive choroidal neovascularization: Secondary | ICD-10-CM | POA: Diagnosis not present

## 2015-08-04 DIAGNOSIS — Z01419 Encounter for gynecological examination (general) (routine) without abnormal findings: Secondary | ICD-10-CM | POA: Diagnosis not present

## 2015-08-26 DIAGNOSIS — H353222 Exudative age-related macular degeneration, left eye, with inactive choroidal neovascularization: Secondary | ICD-10-CM | POA: Diagnosis not present

## 2015-09-20 ENCOUNTER — Other Ambulatory Visit: Payer: Self-pay | Admitting: Family Medicine

## 2015-09-20 ENCOUNTER — Other Ambulatory Visit (HOSPITAL_COMMUNITY): Payer: Self-pay | Admitting: Family Medicine

## 2015-09-20 ENCOUNTER — Ambulatory Visit
Admission: RE | Admit: 2015-09-20 | Discharge: 2015-09-20 | Disposition: A | Discharge status: Home / Self Care | Payer: Medicare Other | Source: Ambulatory Visit | Attending: Family Medicine | Admitting: Family Medicine

## 2015-09-20 DIAGNOSIS — Z1211 Encounter for screening for malignant neoplasm of colon: Secondary | ICD-10-CM | POA: Diagnosis not present

## 2015-09-20 DIAGNOSIS — R232 Flushing: Secondary | ICD-10-CM | POA: Diagnosis not present

## 2015-09-20 DIAGNOSIS — R079 Chest pain, unspecified: Secondary | ICD-10-CM | POA: Diagnosis not present

## 2015-09-20 DIAGNOSIS — Z0000 Encounter for general adult medical examination without abnormal findings: Secondary | ICD-10-CM | POA: Diagnosis not present

## 2015-09-20 DIAGNOSIS — I10 Essential (primary) hypertension: Secondary | ICD-10-CM | POA: Diagnosis not present

## 2015-09-20 DIAGNOSIS — R0789 Other chest pain: Secondary | ICD-10-CM

## 2015-09-20 DIAGNOSIS — M899 Disorder of bone, unspecified: Secondary | ICD-10-CM | POA: Diagnosis not present

## 2015-09-22 ENCOUNTER — Other Ambulatory Visit: Payer: Self-pay

## 2015-09-22 ENCOUNTER — Ambulatory Visit (HOSPITAL_COMMUNITY): Payer: Medicare Other | Attending: Cardiology

## 2015-09-22 DIAGNOSIS — E785 Hyperlipidemia, unspecified: Secondary | ICD-10-CM | POA: Diagnosis not present

## 2015-09-22 DIAGNOSIS — R079 Chest pain, unspecified: Secondary | ICD-10-CM

## 2015-09-22 DIAGNOSIS — I10 Essential (primary) hypertension: Secondary | ICD-10-CM | POA: Insufficient documentation

## 2015-09-22 DIAGNOSIS — I358 Other nonrheumatic aortic valve disorders: Secondary | ICD-10-CM | POA: Insufficient documentation

## 2015-10-06 DIAGNOSIS — Z1211 Encounter for screening for malignant neoplasm of colon: Secondary | ICD-10-CM | POA: Diagnosis not present

## 2015-11-18 DIAGNOSIS — H353221 Exudative age-related macular degeneration, left eye, with active choroidal neovascularization: Secondary | ICD-10-CM | POA: Diagnosis not present

## 2015-11-18 DIAGNOSIS — H353112 Nonexudative age-related macular degeneration, right eye, intermediate dry stage: Secondary | ICD-10-CM | POA: Diagnosis not present

## 2015-12-10 DIAGNOSIS — Z23 Encounter for immunization: Secondary | ICD-10-CM | POA: Diagnosis not present

## 2015-12-17 ENCOUNTER — Telehealth: Payer: Self-pay | Admitting: Cardiovascular Disease

## 2015-12-17 NOTE — Telephone Encounter (Signed)
Received records from [facility] for appointment on 01/06/16 with Dr Royann Shiversroitoru.  [facility] for Dr Croitoru's schedule on 01/06/16. lp

## 2016-01-06 ENCOUNTER — Ambulatory Visit: Payer: Medicare Other | Admitting: Cardiovascular Disease

## 2016-01-06 DIAGNOSIS — H8110 Benign paroxysmal vertigo, unspecified ear: Secondary | ICD-10-CM | POA: Diagnosis not present

## 2016-01-13 DIAGNOSIS — H353221 Exudative age-related macular degeneration, left eye, with active choroidal neovascularization: Secondary | ICD-10-CM | POA: Diagnosis not present

## 2016-01-28 DIAGNOSIS — Z961 Presence of intraocular lens: Secondary | ICD-10-CM | POA: Diagnosis not present

## 2016-01-28 DIAGNOSIS — H353211 Exudative age-related macular degeneration, right eye, with active choroidal neovascularization: Secondary | ICD-10-CM | POA: Diagnosis not present

## 2016-02-11 ENCOUNTER — Telehealth: Payer: Self-pay | Admitting: Cardiovascular Disease

## 2016-02-11 DIAGNOSIS — H26492 Other secondary cataract, left eye: Secondary | ICD-10-CM | POA: Diagnosis not present

## 2016-02-11 DIAGNOSIS — Z961 Presence of intraocular lens: Secondary | ICD-10-CM | POA: Diagnosis not present

## 2016-02-11 DIAGNOSIS — H353211 Exudative age-related macular degeneration, right eye, with active choroidal neovascularization: Secondary | ICD-10-CM | POA: Diagnosis not present

## 2016-02-11 NOTE — Telephone Encounter (Signed)
[facility] for appointment on 01/06/16 that was cancelled [facility] for new appointment on 02/21/16 with Dr Royann Shiversroitoru. lp

## 2016-02-16 DIAGNOSIS — H353111 Nonexudative age-related macular degeneration, right eye, early dry stage: Secondary | ICD-10-CM | POA: Diagnosis not present

## 2016-02-16 DIAGNOSIS — H353221 Exudative age-related macular degeneration, left eye, with active choroidal neovascularization: Secondary | ICD-10-CM | POA: Diagnosis not present

## 2016-02-16 DIAGNOSIS — H43813 Vitreous degeneration, bilateral: Secondary | ICD-10-CM | POA: Diagnosis not present

## 2016-02-21 ENCOUNTER — Encounter: Payer: Self-pay | Admitting: Cardiology

## 2016-02-21 ENCOUNTER — Encounter: Payer: Self-pay | Admitting: Cardiovascular Disease

## 2016-02-21 ENCOUNTER — Ambulatory Visit (INDEPENDENT_AMBULATORY_CARE_PROVIDER_SITE_OTHER): Payer: Medicare Other | Admitting: Cardiovascular Disease

## 2016-02-21 VITALS — BP 122/66 | HR 70 | Ht 65.0 in | Wt 162.0 lb

## 2016-02-21 DIAGNOSIS — I341 Nonrheumatic mitral (valve) prolapse: Secondary | ICD-10-CM

## 2016-02-21 DIAGNOSIS — R0600 Dyspnea, unspecified: Secondary | ICD-10-CM

## 2016-02-21 DIAGNOSIS — Z01812 Encounter for preprocedural laboratory examination: Secondary | ICD-10-CM

## 2016-02-21 DIAGNOSIS — R5383 Other fatigue: Secondary | ICD-10-CM

## 2016-02-21 DIAGNOSIS — I208 Other forms of angina pectoris: Secondary | ICD-10-CM

## 2016-02-21 DIAGNOSIS — R0609 Other forms of dyspnea: Secondary | ICD-10-CM

## 2016-02-21 DIAGNOSIS — I2089 Other forms of angina pectoris: Secondary | ICD-10-CM

## 2016-02-21 DIAGNOSIS — D689 Coagulation defect, unspecified: Secondary | ICD-10-CM

## 2016-02-21 NOTE — Patient Instructions (Signed)
Your physician has requested that you have a left cardiac catheterization with Dr SwazilandJordan in January 2018. Cardiac catheterization is used to diagnose and/or treat various heart conditions. Doctors may recommend this procedure for a number of different reasons. The most common reason is to evaluate chest pain. Chest pain can be a symptom of coronary artery disease (CAD), and cardiac catheterization can show whether plaque is narrowing or blocking your heart's arteries. This procedure is also used to evaluate the valves, as well as measure the blood flow and oxygen levels in different parts of your heart. For further information please visit [email or web address].   Following your catheterization, you will not be allowed to drive for 3 days. No lifting, pushing, or pulling greater that 10 pounds is allowed for 1 week.  You will be required to have the following tests prior to the procedure:  1. Blood work-the blood work can be done no more than 7 days prior to the procedure. It can be done at any [facility] lab. There is one downstairs on the first floor of this building and one in the [facility] building [phone]([address]).  2. Chest Xray-the chest xray order has already been placed at [facility].

## 2016-02-21 NOTE — Progress Notes (Signed)
Cardiology Office Note    Date:  02/21/2016   ID:  [name], DOB Jan 11, 1946, MRN [id number]  PCP:  Lupita Raider, MD  Cardiologist:   Thurmon Fair, MD   Chief Complaint  Patient presents with  . New Evaluation    PT C/O OCCASSIONAL CHEST PAIN AND DOE    History of Present Illness:  Cathy Greene is a 70 y.o. female without known cardiovascular illness and presents with typical complaints of exertional angina pectoris.  Symptoms have been going on for years and a rather steady, predictable pattern. She typically walks after supper every evening in the spring and summer. Avoids walking in the winter when the air is cold. She walks in a neighborhood that is slightly hilly, but the inclines and not sleep. Typically after 2 blocks she develops chest tightness that radiates to her left arm and resolves promptly with rest. Sometimes she does not have to stop altogether but just slows her pace and this allows [place] symptoms to resolve. There is some accompanying mild dyspnea. She never experiences these symptoms at rest.  A variety of empirical treatments have been attempted. She was given albuterol without relief. Carvedilol did not help her exertional symptoms. After the echo reported diastolic dysfunction she was given low-dose furosemide and potassium, again without any improvement in her symptoms.  In 2014 she had an extensive workup under the recommendation of Dr. Swaziland and Dr. Delton Coombes. This included a chest CT with a calcium score of 0, a normal myocardial perfusion stress test, minimally abnormal pulmonary function tests and a normal VQ scan. She had previously had a normal nuclear stress test in 2012 as well.  Earlier this year she underwent an echocardiogram that was a normal test. Ejection fraction was 65-70%. Wall motion was normal. She was labeled as having "grade 1 diastolic dysfunction" but the left atrium was normal in diameter, there is no left ventricular hypertrophy and the  actual Doppler values are normal for a 70 year old (lateral e' 9.5, E/average e' 7.2). Systolic pulmonary artery pressure was estimated at 25 mmHg. There was aortic valve sclerosis without stenosis and there was no significant mitral regurgitation. Previously described mitral valve prolapse was not confirmed on this last echo.   She does not have problems with anemia or hyperthyroidism. She has never smoked, has well treated hypertension and has not had problems with markedly elevated cholesterol (most recent LDL 113, HDL 60, cholesterol 190 with triglycerides 86). In fact, her only active medical problem is macular degeneration, which has not improved with avastin, now on eylea.    Past Medical History:  Diagnosis Date  . Abnormal uterine bleeding   . Arthritis FINGERS AND KNEES  . Chest pain, exertional    a. ETT-Myoview 6/12: no ischemia, EF 66%;  b.  ETT-Myoview 6/14: No scar or ischemia, EF 69%  . Heart murmur   . History of kidney stones dec 2013  . Hypertension   . Osteopenia   . PONV (postoperative nausea and vomiting) 1979   with laparoscopy  . Rosacea     Past Surgical History:  Procedure Laterality Date  . BENIGN BREAST BX    . CESAREAN SECTION     X3   LAST ONE 1984  W/ TUBAL LIGATION  . LAPAROSCOPY  1979  . TONSILLECTOMY AND ADENOIDECTOMY  AGE 36  . TOTAL KNEE ARTHROPLASTY Left 06/01/2014   Procedure: LEFT TOTAL KNEE ARTHROPLASTY;  Surgeon: Ollen Gross, MD;  Location: [facility];  Service: Orthopedics;  Laterality:  Left;  . uterine fibroid removed  2013    Current Medications: Outpatient Medications Prior to Visit  Medication Sig Dispense Refill  . lisinopril-hydrochlorothiazide (PRINZIDE,ZESTORETIC) 20-25 MG per tablet HOLD UNTIL FURTHER NOTIFIED BY CARDIOLOGY (Patient taking differently: Take 0.5 tablets by mouth every morning. )    . HYDROmorphone (DILAUDID) 2 MG tablet Take 1-2 tablets (2-4 mg total) by mouth every 3 (three) hours as needed for moderate pain or  severe pain. (Patient not taking: Reported on 02/21/2016) 90 tablet 0  . methocarbamol (ROBAXIN) 500 MG tablet Take 1 tablet (500 mg total) by mouth every 6 (six) hours as needed for muscle spasms. (Patient not taking: Reported on 02/21/2016) 80 tablet 0  . ondansetron (ZOFRAN) 4 MG tablet Take 1 tablet (4 mg total) by mouth every 6 (six) hours as needed for nausea. (Patient not taking: Reported on 02/21/2016) 40 tablet 0  . rivaroxaban (XARELTO) 10 MG TABS tablet Take 1 tablet (10 mg total) by mouth daily with breakfast. Take Xarelto for two and a half more weeks, then discontinue Xarelto. Once the patient has completed the blood thinner regimen, then take a Baby 81 mg Aspirin daily for three more weeks. (Patient not taking: Reported on 02/21/2016) 19 tablet 0  . traMADol (ULTRAM) 50 MG tablet Take 1-2 tablets (50-100 mg total) by mouth every 6 (six) hours as needed (mild pain). (Patient not taking: Reported on 02/21/2016) 80 tablet 1   No facility-administered medications prior to visit.      Allergies:   Codeine   Social History   Social History  . Marital status: Married    Spouse name: N/A  . Number of children: 3  . Years of education: N/A   Occupational History  . pre school/ medical technology Retired   Social History Main Topics  . Smoking status: Never Smoker  . Smokeless tobacco: Never Used  . Alcohol use No  . Drug use: No  . Sexual activity: Not Asked   Other Topics Concern  . None   Social History Narrative  . None     Family History:  The patient's family history includes Arthritis in her mother and sister; Asthma in her sister; Cancer in her father; Diabetes in her maternal grandmother; Hypertension in her mother and sister; Lung cancer in her father.   ROS:   Please see the history of present illness.    ROS All other systems reviewed and are negative.   PHYSICAL EXAM:   VS:  BP 122/66   Pulse 70   Ht 5\' 5"  (1.651 m)   Wt 162 lb (73.5 kg)   SpO2 99%    BMI 26.96 kg/m    GEN: Well nourished, well developed, in no acute distress  HEENT: normal  Neck: no JVD, carotid bruits, or masses Cardiac: RRR; no murmurs, rubs, or gallops,no edema  Respiratory:  clear to auscultation bilaterally, normal work of breathing GI: soft, nontender, nondistended, + BS MS: no deformity or atrophy  Skin: warm and dry, no rash Neuro:  Alert and Oriented x 3, Strength and sensation are intact Psych: euthymic mood, full affect  Wt Readings from Last 3 Encounters:  02/21/16 162 lb (73.5 kg)  06/01/14 175 lb 3 oz (79.5 kg)  05/25/14 175 lb 3.2 oz (79.5 kg)      Studies/Labs Reviewed:   EKG:  EKG is ordered today.  The ekg ordered today demonstrates NSR, normal tracing  Recent Labs: Potassium 4.0, creatinine 0.6, normal liver function tests, glucose 96  09/20/2015  Lipid Panel LDL 113, HDL 60, cholesterol 190 with triglycerides 86 09/20/2015  Additional studies/ records that were reviewed today include:  Notes from Dr. Clelia CroftShaw Nuclear images 2012 and 2014 Echo images    ASSESSMENT:    1. Stable angina (HCC)   2. MVP (mitral valve prolapse)   3. Dyspnea on exertion   4. Other fatigue   5. Pre-procedure lab exam   6. Blood clotting disorder (HCC)      PLAN:  In order of problems listed above:  1. Exertional angina: Her symptoms are strongly suggestive of this diagnosis, but she does not really have risk factors for coronary artery disease nor any findings on noninvasive testing to support the diagnosis. After some discussion about various ways to proceed, we have both concluded that a definitive diagnosis is necessary. She will undergo coronary angiography with Dr. SwazilandJordan. This procedure has been fully reviewed with the patient and informed consent has been obtained. Also reviewed the option for immediate percutaneous revascularization at the time of the diagnostic study, should this be appropriate. Reviewed the increased risks of complications  with interventional procedures as compared with the diagnostic procedure only. Current lipid levels did not require pharmacological therapy unless we identify atherosclerotic disease. 2. Mitral valve prolapse I have reviewed her echo images and concur that this is not an accurate diagnosis. 3. Dyspnea: She had some subtle abnormalities on her PFTs in 2014 and does have a chest x-ray does suggest hyperinflation. I'm not sure how to correlate these findings with her exertional chest discomfort, but if her cardiac cath is normal suggest repeat pulmonary evaluation.    Medication Adjustments/Labs and Tests Ordered: Current medicines are reviewed at length with the patient today.  Concerns regarding medicines are outlined above.  Medication changes, Labs and Tests ordered today are listed in the Patient Instructions below. Patient Instructions  Your physician has requested that you have a left cardiac catheterization with Dr SwazilandJordan in January 2018. Cardiac catheterization is used to diagnose and/or treat various heart conditions. Doctors may recommend this procedure for a number of different reasons. The most common reason is to evaluate chest pain. Chest pain can be a symptom of coronary artery disease (CAD), and cardiac catheterization can show whether plaque is narrowing or blocking your heart's arteries. This procedure is also used to evaluate the valves, as well as measure the blood flow and oxygen levels in different parts of your heart. For further information please visit [email or web address].   Following your catheterization, you will not be allowed to drive for 3 days. No lifting, pushing, or pulling greater that 10 pounds is allowed for 1 week.  You will be required to have the following tests prior to the procedure:  1. Blood work-the blood work can be done no more than 7 days prior to the procedure. It can be done at any [facility] lab. There is one downstairs on the first floor of this building  and one in the [facility] building [phone]([address], [address]).  2. Chest Xray-the chest xray order has already been placed at the [facility].    Signed, Thurmon FairMihai Shevette Bess, MD  02/21/2016 1:26 PM    [facility] [address], [place], [place]  [address] Phone: [phone]; Fax: [phone]

## 2016-03-02 ENCOUNTER — Other Ambulatory Visit: Payer: Self-pay | Admitting: Cardiovascular Disease

## 2016-03-02 DIAGNOSIS — I208 Other forms of angina pectoris: Secondary | ICD-10-CM

## 2016-03-17 DIAGNOSIS — H353221 Exudative age-related macular degeneration, left eye, with active choroidal neovascularization: Secondary | ICD-10-CM | POA: Diagnosis not present

## 2016-03-17 DIAGNOSIS — Z01812 Encounter for preprocedural laboratory examination: Secondary | ICD-10-CM | POA: Diagnosis not present

## 2016-03-17 DIAGNOSIS — I208 Other forms of angina pectoris: Secondary | ICD-10-CM | POA: Diagnosis not present

## 2016-03-17 DIAGNOSIS — D689 Coagulation defect, unspecified: Secondary | ICD-10-CM | POA: Diagnosis not present

## 2016-03-17 DIAGNOSIS — H353111 Nonexudative age-related macular degeneration, right eye, early dry stage: Secondary | ICD-10-CM | POA: Diagnosis not present

## 2016-03-17 DIAGNOSIS — R5383 Other fatigue: Secondary | ICD-10-CM | POA: Diagnosis not present

## 2016-03-17 LAB — CBC
HCT: 41.6 % (ref 35.0–45.0)
Hemoglobin: 13.8 g/dL (ref 11.7–15.5)
MCH: 29.5 pg (ref 27.0–33.0)
MCHC: 33.2 g/dL (ref 32.0–36.0)
MCV: 88.9 fL (ref 80.0–100.0)
MPV: 10 fL (ref 7.5–12.5)
Platelets: 247 10*3/uL (ref 140–400)
RBC: 4.68 MIL/uL (ref 3.80–5.10)
RDW: 14 % (ref 11.0–15.0)
WBC: 5.3 10*3/uL (ref 3.8–10.8)

## 2016-03-17 LAB — BASIC METABOLIC PANEL
BUN: 10 mg/dL (ref 7–25)
CALCIUM: 9.5 mg/dL (ref 8.6–10.4)
CO2: 28 mmol/L (ref 20–31)
CREATININE: 0.6 mg/dL (ref 0.60–0.93)
Chloride: 103 mmol/L (ref 98–110)
GLUCOSE: 98 mg/dL (ref 65–99)
Potassium: 4.2 mmol/L (ref 3.5–5.3)
Sodium: 139 mmol/L (ref 135–146)

## 2016-03-17 LAB — APTT: aPTT: 28 s (ref 22–34)

## 2016-03-17 LAB — PROTIME-INR
INR: 1
Prothrombin Time: 10.9 s (ref 9.0–11.5)

## 2016-03-17 LAB — TSH: TSH: 1.55 mIU/L

## 2016-03-24 ENCOUNTER — Encounter (HOSPITAL_COMMUNITY)
Admission: RE | Disposition: A | Discharge status: Home / Self Care | Payer: Self-pay | Source: Ambulatory Visit | Attending: Cardiology

## 2016-03-24 ENCOUNTER — Ambulatory Visit (HOSPITAL_COMMUNITY)
Admission: RE | Admit: 2016-03-24 | Discharge: 2016-03-24 | Disposition: A | Discharge status: Home / Self Care | Payer: Medicare Other | Source: Ambulatory Visit | Attending: Cardiology | Admitting: Cardiology

## 2016-03-24 DIAGNOSIS — Z96652 Presence of left artificial knee joint: Secondary | ICD-10-CM | POA: Insufficient documentation

## 2016-03-24 DIAGNOSIS — I25119 Atherosclerotic heart disease of native coronary artery with unspecified angina pectoris: Secondary | ICD-10-CM | POA: Diagnosis not present

## 2016-03-24 DIAGNOSIS — R0609 Other forms of dyspnea: Secondary | ICD-10-CM | POA: Diagnosis not present

## 2016-03-24 DIAGNOSIS — I341 Nonrheumatic mitral (valve) prolapse: Secondary | ICD-10-CM | POA: Insufficient documentation

## 2016-03-24 DIAGNOSIS — Z7901 Long term (current) use of anticoagulants: Secondary | ICD-10-CM | POA: Insufficient documentation

## 2016-03-24 DIAGNOSIS — I10 Essential (primary) hypertension: Secondary | ICD-10-CM | POA: Diagnosis not present

## 2016-03-24 DIAGNOSIS — D689 Coagulation defect, unspecified: Secondary | ICD-10-CM | POA: Diagnosis not present

## 2016-03-24 DIAGNOSIS — I2510 Atherosclerotic heart disease of native coronary artery without angina pectoris: Secondary | ICD-10-CM

## 2016-03-24 DIAGNOSIS — I208 Other forms of angina pectoris: Secondary | ICD-10-CM

## 2016-03-24 DIAGNOSIS — E7800 Pure hypercholesterolemia, unspecified: Secondary | ICD-10-CM | POA: Diagnosis present

## 2016-03-24 DIAGNOSIS — H3530 Unspecified macular degeneration: Secondary | ICD-10-CM | POA: Insufficient documentation

## 2016-03-24 DIAGNOSIS — R5383 Other fatigue: Secondary | ICD-10-CM | POA: Diagnosis not present

## 2016-03-24 DIAGNOSIS — R079 Chest pain, unspecified: Secondary | ICD-10-CM | POA: Diagnosis not present

## 2016-03-24 HISTORY — PX: CARDIAC CATHETERIZATION: SHX172

## 2016-03-24 SURGERY — LEFT HEART CATH AND CORONARY ANGIOGRAPHY
Anesthesia: LOCAL

## 2016-03-24 MED ORDER — MIDAZOLAM HCL 2 MG/2ML IJ SOLN
INTRAMUSCULAR | Status: AC
  Filled 2016-03-24: qty 2

## 2016-03-24 MED ORDER — HEPARIN (PORCINE) IN NACL 2-0.9 UNIT/ML-% IJ SOLN
INTRAMUSCULAR | Status: DC | PRN
  Administered 2016-03-24: 1000 mL via INTRA_ARTERIAL

## 2016-03-24 MED ORDER — MIDAZOLAM HCL 2 MG/2ML IJ SOLN
INTRAMUSCULAR | Status: DC | PRN
  Administered 2016-03-24: 1 mg via INTRAVENOUS

## 2016-03-24 MED ORDER — VERAPAMIL HCL 2.5 MG/ML IV SOLN
INTRAVENOUS | Status: AC
  Filled 2016-03-24: qty 2

## 2016-03-24 MED ORDER — HEPARIN (PORCINE) IN NACL 2-0.9 UNIT/ML-% IJ SOLN
INTRAMUSCULAR | Status: AC
  Filled 2016-03-24: qty 1000

## 2016-03-24 MED ORDER — SODIUM CHLORIDE 0.9 % WEIGHT BASED INFUSION: 1.0000 mL/kg/h | INTRAVENOUS | Status: DC

## 2016-03-24 MED ORDER — IOPAMIDOL (ISOVUE-370) INJECTION 76%
INTRAVENOUS | Status: AC
  Filled 2016-03-24: qty 100

## 2016-03-24 MED ORDER — FENTANYL CITRATE (PF) 100 MCG/2ML IJ SOLN
INTRAMUSCULAR | Status: DC | PRN
  Administered 2016-03-24: 25 ug via INTRAVENOUS

## 2016-03-24 MED ORDER — SODIUM CHLORIDE 0.9% FLUSH: 3.0000 mL | INTRAVENOUS | Status: DC | PRN

## 2016-03-24 MED ORDER — ASPIRIN 81 MG PO CHEW
CHEWABLE_TABLET | ORAL | Status: AC
  Administered 2016-03-24: 81 mg via ORAL
  Filled 2016-03-24: qty 1

## 2016-03-24 MED ORDER — SODIUM CHLORIDE 0.9 % WEIGHT BASED INFUSION
3.0000 mL/kg/h | INTRAVENOUS | Status: DC
  Administered 2016-03-24: 3 mL/kg/h via INTRAVENOUS

## 2016-03-24 MED ORDER — ASPIRIN 81 MG PO CHEW
81.0000 mg | CHEWABLE_TABLET | ORAL | Status: AC
  Administered 2016-03-24: 81 mg via ORAL

## 2016-03-24 MED ORDER — HEPARIN SODIUM (PORCINE) 1000 UNIT/ML IJ SOLN
INTRAMUSCULAR | Status: DC | PRN
  Administered 2016-03-24: 4000 [IU] via INTRAVENOUS

## 2016-03-24 MED ORDER — LIDOCAINE HCL (PF) 1 % IJ SOLN
INTRAMUSCULAR | Status: AC
  Filled 2016-03-24: qty 30

## 2016-03-24 MED ORDER — HEPARIN SODIUM (PORCINE) 1000 UNIT/ML IJ SOLN
INTRAMUSCULAR | Status: AC
  Filled 2016-03-24: qty 1

## 2016-03-24 MED ORDER — FENTANYL CITRATE (PF) 100 MCG/2ML IJ SOLN
INTRAMUSCULAR | Status: AC
  Filled 2016-03-24: qty 2

## 2016-03-24 MED ORDER — SODIUM CHLORIDE 0.9 % IV SOLN: 250.0000 mL | INTRAVENOUS | Status: DC | PRN

## 2016-03-24 MED ORDER — IOPAMIDOL (ISOVUE-370) INJECTION 76%
INTRAVENOUS | Status: DC | PRN
  Administered 2016-03-24: 80 mL via INTRA_ARTERIAL

## 2016-03-24 MED ORDER — LIDOCAINE HCL (PF) 1 % IJ SOLN
INTRAMUSCULAR | Status: DC | PRN
  Administered 2016-03-24: 1 mL via INTRADERMAL

## 2016-03-24 MED ORDER — VERAPAMIL HCL 2.5 MG/ML IV SOLN
INTRAVENOUS | Status: DC | PRN
  Administered 2016-03-24: 10 mL via INTRA_ARTERIAL

## 2016-03-24 MED ORDER — SODIUM CHLORIDE 0.9% FLUSH: 3.0000 mL | Freq: Two times a day (BID) | INTRAVENOUS | Status: DC

## 2016-03-24 SURGICAL SUPPLY — 12 items
CATH EXPO 5F FL3.5 (CATHETERS) ×2 IMPLANT
CATH EXPO 5FR ANG PIGTAIL 145 (CATHETERS) ×2 IMPLANT
CATH INFINITI JR4 5F (CATHETERS) ×2 IMPLANT
DEVICE RAD COMP TR BAND LRG (VASCULAR PRODUCTS) ×2 IMPLANT
GLIDESHEATH SLEND SS 6F .021 (SHEATH) ×2 IMPLANT
GUIDEWIRE INQWIRE 1.5J.035X260 (WIRE) ×1 IMPLANT
INQWIRE 1.5J .035X260CM (WIRE) ×2
KIT HEART LEFT (KITS) ×2 IMPLANT
PACK CARDIAC CATHETERIZATION (CUSTOM PROCEDURE TRAY) ×2 IMPLANT
SYR MEDRAD MARK V 150ML (SYRINGE) ×2 IMPLANT
TRANSDUCER W/STOPCOCK (MISCELLANEOUS) ×2 IMPLANT
TUBING CIL FLEX 10 FLL-RA (TUBING) ×2 IMPLANT

## 2016-03-24 NOTE — Discharge Instructions (Signed)

## 2016-03-24 NOTE — Interval H&P Note (Signed)
History and Physical Interval Note:  03/24/2016 10:33 AM  [name]  has presented today for surgery, with the diagnosis of ANGINA, FATIGUE   The various methods of treatment have been discussed with the patient and family. After consideration of risks, benefits and other options for treatment, the patient has consented to  Procedure(s): Left Heart Cath and Coronary Angiography (N/A) as a surgical intervention .  The patient's history has been reviewed, patient examined, no change in status, stable for surgery.  I have reviewed the patient's chart and labs.  Questions were answered to the patient's satisfaction.    Cath Lab Visit (complete for each Cath Lab visit)  Clinical Evaluation Leading to the Procedure:   ACS: No.  Non-ACS:    Anginal Classification: CCS II  Anti-ischemic medical therapy: No Therapy  Non-Invasive Test Results: No non-invasive testing performed  Prior CABG: No previous CABG       Theron Aristaeter [facility] Surgery CenterJordanMD,FACC 03/24/2016 10:33 AM

## 2016-03-24 NOTE — Progress Notes (Signed)
Thanks! Kosisochukwu Goldberg 

## 2016-03-24 NOTE — H&P (Signed)
Cardiology Office Note    Date:  02/21/2016   ID:  Cathy Greene, DOB 08/12/1945, MRN [id number]  PCP:  Lupita RaiderSHAW,KIMBERLEE, MD          Cardiologist:   Thurmon FairMihai Croitoru, MD       Chief Complaint  Patient presents with  . New Evaluation    PT C/O OCCASSIONAL CHEST PAIN AND DOE    History of Present Illness:  Cathy Greene is a 71 y.o. female without known cardiovascular illness and presents with typical complaints of exertional angina pectoris.  Symptoms have been going on for years and a rather steady, predictable pattern. She typically walks after supper every evening in the spring and summer. Avoids walking in the winter when the air is cold. She walks in a neighborhood that is slightly hilly, but the inclines and not sleep. Typically after 2 blocks she develops chest tightness that radiates to her left arm and resolves promptly with rest. Sometimes she does not have to stop altogether but just slows her pace and this allows us symptoms to resolve. There is some accompanying mild dyspnea. She never experiences these symptoms at rest.  A variety of empirical treatments have been attempted. She was given albuterol without relief. Carvedilol did not help her exertional symptoms. After the echo reported diastolic dysfunction she was given low-dose furosemide and potassium, again without any improvement in her symptoms.  In 2014 she had an extensive workup under the recommendation of Dr. SwazilandJordan and Dr. Delton CoombesByrum. This included a chest CT with a calcium score of 0, a normal myocardial perfusion stress test, minimally abnormal pulmonary function tests and a normal VQ scan. She had previously had a normal nuclear stress test in 2012 as well.  Earlier this year she underwent an echocardiogram that was a normal test. Ejection fraction was 65-70%. Wall motion was normal. She was labeled as having "grade 1 diastolic dysfunction" but the left atrium was normal in diameter, there is no left  ventricular hypertrophy and the actual Doppler values are normal for a 71 year old (lateral e' 9.5, E/average e' 7.2). Systolic pulmonary artery pressure was estimated at 25 mmHg. There was aortic valve sclerosis without stenosis and there was no significant mitral regurgitation. Previously described mitral valve prolapse was not confirmed on this last echo.   She does not have problems with anemia or hyperthyroidism. She has never smoked, has well treated hypertension and has not had problems with markedly elevated cholesterol (most recent LDL 113, HDL 60, cholesterol 190 with triglycerides 86). In fact, her only active medical problem is macular degeneration, which has not improved with avastin, now on eylea.        Past Medical History:  Diagnosis Date  . Abnormal uterine bleeding   . Arthritis FINGERS AND KNEES  . Chest pain, exertional    a. ETT-Myoview 6/12: no ischemia, EF 66%;  b.  ETT-Myoview 6/14: No scar or ischemia, EF 69%  . Heart murmur   . History of kidney stones dec 2013  . Hypertension   . Osteopenia   . PONV (postoperative nausea and vomiting) 1979   with laparoscopy  . Rosacea          Past Surgical History:  Procedure Laterality Date  . BENIGN BREAST BX    . CESAREAN SECTION     X3   LAST ONE 1984  W/ TUBAL LIGATION  . LAPAROSCOPY  1979  . TONSILLECTOMY AND ADENOIDECTOMY  AGE 84  . TOTAL KNEE ARTHROPLASTY Left 06/01/2014  Procedure: LEFT TOTAL KNEE ARTHROPLASTY;  Surgeon: Ollen Gross, MD;  Location: [facility];  Service: Orthopedics;  Laterality: Left;  . uterine fibroid removed  2013    Current Medications:       Outpatient Medications Prior to Visit  Medication Sig Dispense Refill  . lisinopril-hydrochlorothiazide (PRINZIDE,ZESTORETIC) 20-25 MG per tablet HOLD UNTIL FURTHER NOTIFIED BY CARDIOLOGY (Patient taking differently: Take 0.5 tablets by mouth every morning. )    . HYDROmorphone (DILAUDID) 2 MG tablet Take 1-2 tablets (2-4  mg total) by mouth every 3 (three) hours as needed for moderate pain or severe pain. (Patient not taking: Reported on 02/21/2016) 90 tablet 0  . methocarbamol (ROBAXIN) 500 MG tablet Take 1 tablet (500 mg total) by mouth every 6 (six) hours as needed for muscle spasms. (Patient not taking: Reported on 02/21/2016) 80 tablet 0  . ondansetron (ZOFRAN) 4 MG tablet Take 1 tablet (4 mg total) by mouth every 6 (six) hours as needed for nausea. (Patient not taking: Reported on 02/21/2016) 40 tablet 0  . rivaroxaban (XARELTO) 10 MG TABS tablet Take 1 tablet (10 mg total) by mouth daily with breakfast. Take Xarelto for two and a half more weeks, then discontinue Xarelto. Once the patient has completed the blood thinner regimen, then take a Baby 81 mg Aspirin daily for three more weeks. (Patient not taking: Reported on 02/21/2016) 19 tablet 0  . traMADol (ULTRAM) 50 MG tablet Take 1-2 tablets (50-100 mg total) by mouth every 6 (six) hours as needed (mild pain). (Patient not taking: Reported on 02/21/2016) 80 tablet 1   No facility-administered medications prior to visit.      Allergies:   Codeine   Social History        Social History  . Marital status: Married    Spouse name: N/A  . Number of children: 3  . Years of education: N/A       Occupational History  . pre school/ medical technology Retired       Social History Main Topics  . Smoking status: Never Smoker  . Smokeless tobacco: Never Used  . Alcohol use No  . Drug use: No  . Sexual activity: Not Asked       Other Topics Concern  . None      Social History Narrative  . None     Family History:  The patient's family history includes Arthritis in her mother and sister; Asthma in her sister; Cancer in her father; Diabetes in her maternal grandmother; Hypertension in her mother and sister; Lung cancer in her father.   ROS:   Please see the history of present illness.    ROS All other systems reviewed and are  negative.   PHYSICAL EXAM:   VS:  BP 122/66   Pulse 70   Ht 5\' 5"  (1.651 m)   Wt 162 lb (73.5 kg)   SpO2 99%   BMI 26.96 kg/m    GEN: Well nourished, well developed, in no acute distress  HEENT: normal  Neck: no JVD, carotid bruits, or masses Cardiac: RRR; no murmurs, rubs, or gallops,no edema  Respiratory:  clear to auscultation bilaterally, normal work of breathing GI: soft, nontender, nondistended, + BS MS: no deformity or atrophy  Skin: warm and dry, no rash Neuro:  Alert and Oriented x 3, Strength and sensation are intact Psych: euthymic mood, full affect     Wt Readings from Last 3 Encounters:  02/21/16 162 lb (73.5 kg)  06/01/14 175 lb 3  oz (79.5 kg)  05/25/14 175 lb 3.2 oz (79.5 kg)      Studies/Labs Reviewed:   EKG:  EKG is ordered today.  The ekg ordered today demonstrates NSR, normal tracing  Recent Labs: Potassium 4.0, creatinine 0.6, normal liver function tests, glucose 96 09/20/2015  Lipid Panel LDL 113, HDL 60, cholesterol 190 with triglycerides 86 09/20/2015  Additional studies/ records that were reviewed today include:  Notes from Dr. Clelia Croft Nuclear images 2012 and 2014 Echo images    ASSESSMENT:    1. Stable angina (HCC)   2. MVP (mitral valve prolapse)   3. Dyspnea on exertion   4. Other fatigue   5. Pre-procedure lab exam   6. Blood clotting disorder (HCC)      PLAN:  In order of problems listed above:  1. Exertional angina: Her symptoms are strongly suggestive of this diagnosis, but she does not really have risk factors for coronary artery disease nor any findings on noninvasive testing to support the diagnosis. After some discussion about various ways to proceed, we have both concluded that a definitive diagnosis is necessary. She will undergo coronary angiography today. This procedure has been fully reviewed with the patient and informed consent has been obtained. Also reviewed the option for immediate percutaneous  revascularization at the time of the diagnostic study, should this be appropriate. Reviewed the increased risks of complications with interventional procedures as compared with the diagnostic procedure only. Current lipid levels did not require pharmacological therapy unless we identify atherosclerotic disease. 2. Mitral valve prolapse Her Echo shows no evidence of MV prolapse. 3. Dyspnea: She had some subtle abnormalities on her PFTs in 2014 and does have a chest x-ray does suggest hyperinflation. If her cardiac cath is normal suggest repeat pulmonary evaluation.

## 2016-03-27 ENCOUNTER — Encounter (HOSPITAL_COMMUNITY): Payer: Self-pay | Admitting: Cardiology

## 2016-04-18 DIAGNOSIS — H353112 Nonexudative age-related macular degeneration, right eye, intermediate dry stage: Secondary | ICD-10-CM | POA: Diagnosis not present

## 2016-04-18 DIAGNOSIS — H43813 Vitreous degeneration, bilateral: Secondary | ICD-10-CM | POA: Diagnosis not present

## 2016-04-18 DIAGNOSIS — H353221 Exudative age-related macular degeneration, left eye, with active choroidal neovascularization: Secondary | ICD-10-CM | POA: Diagnosis not present

## 2016-04-26 ENCOUNTER — Other Ambulatory Visit: Payer: Self-pay | Admitting: Family Medicine

## 2016-04-26 DIAGNOSIS — Z1231 Encounter for screening mammogram for malignant neoplasm of breast: Secondary | ICD-10-CM

## 2016-05-11 ENCOUNTER — Ambulatory Visit
Admission: RE | Admit: 2016-05-11 | Discharge: 2016-05-11 | Disposition: A | Discharge status: Home / Self Care | Payer: Medicare Other | Source: Ambulatory Visit | Attending: Family Medicine | Admitting: Family Medicine

## 2016-05-11 DIAGNOSIS — Z1231 Encounter for screening mammogram for malignant neoplasm of breast: Secondary | ICD-10-CM | POA: Diagnosis not present

## 2016-05-16 DIAGNOSIS — H353221 Exudative age-related macular degeneration, left eye, with active choroidal neovascularization: Secondary | ICD-10-CM | POA: Diagnosis not present

## 2016-05-16 DIAGNOSIS — H43813 Vitreous degeneration, bilateral: Secondary | ICD-10-CM | POA: Diagnosis not present

## 2016-05-16 DIAGNOSIS — H353112 Nonexudative age-related macular degeneration, right eye, intermediate dry stage: Secondary | ICD-10-CM | POA: Diagnosis not present

## 2016-06-20 DIAGNOSIS — H353221 Exudative age-related macular degeneration, left eye, with active choroidal neovascularization: Secondary | ICD-10-CM | POA: Diagnosis not present

## 2016-06-20 DIAGNOSIS — H353112 Nonexudative age-related macular degeneration, right eye, intermediate dry stage: Secondary | ICD-10-CM | POA: Diagnosis not present

## 2016-06-20 DIAGNOSIS — H43813 Vitreous degeneration, bilateral: Secondary | ICD-10-CM | POA: Diagnosis not present

## 2016-07-18 DIAGNOSIS — H353221 Exudative age-related macular degeneration, left eye, with active choroidal neovascularization: Secondary | ICD-10-CM | POA: Diagnosis not present

## 2016-07-18 DIAGNOSIS — H43813 Vitreous degeneration, bilateral: Secondary | ICD-10-CM | POA: Diagnosis not present

## 2016-07-18 DIAGNOSIS — H353112 Nonexudative age-related macular degeneration, right eye, intermediate dry stage: Secondary | ICD-10-CM | POA: Diagnosis not present

## 2016-08-09 DIAGNOSIS — Z124 Encounter for screening for malignant neoplasm of cervix: Secondary | ICD-10-CM | POA: Diagnosis not present

## 2016-08-09 DIAGNOSIS — Z01419 Encounter for gynecological examination (general) (routine) without abnormal findings: Secondary | ICD-10-CM | POA: Diagnosis not present

## 2016-08-09 DIAGNOSIS — Z6828 Body mass index (BMI) 28.0-28.9, adult: Secondary | ICD-10-CM | POA: Diagnosis not present

## 2016-08-10 DIAGNOSIS — Z124 Encounter for screening for malignant neoplasm of cervix: Secondary | ICD-10-CM | POA: Diagnosis not present

## 2016-08-14 DIAGNOSIS — N3000 Acute cystitis without hematuria: Secondary | ICD-10-CM | POA: Diagnosis not present

## 2016-08-14 DIAGNOSIS — R350 Frequency of micturition: Secondary | ICD-10-CM | POA: Diagnosis not present

## 2016-08-15 DIAGNOSIS — H43813 Vitreous degeneration, bilateral: Secondary | ICD-10-CM | POA: Diagnosis not present

## 2016-08-15 DIAGNOSIS — H353112 Nonexudative age-related macular degeneration, right eye, intermediate dry stage: Secondary | ICD-10-CM | POA: Diagnosis not present

## 2016-08-15 DIAGNOSIS — H353221 Exudative age-related macular degeneration, left eye, with active choroidal neovascularization: Secondary | ICD-10-CM | POA: Diagnosis not present

## 2016-08-31 DIAGNOSIS — M170 Bilateral primary osteoarthritis of knee: Secondary | ICD-10-CM | POA: Diagnosis not present

## 2016-08-31 DIAGNOSIS — M1712 Unilateral primary osteoarthritis, left knee: Secondary | ICD-10-CM | POA: Diagnosis not present

## 2016-08-31 DIAGNOSIS — M1711 Unilateral primary osteoarthritis, right knee: Secondary | ICD-10-CM | POA: Diagnosis not present

## 2016-09-26 DIAGNOSIS — H353112 Nonexudative age-related macular degeneration, right eye, intermediate dry stage: Secondary | ICD-10-CM | POA: Diagnosis not present

## 2016-09-26 DIAGNOSIS — H353221 Exudative age-related macular degeneration, left eye, with active choroidal neovascularization: Secondary | ICD-10-CM | POA: Diagnosis not present

## 2016-09-26 DIAGNOSIS — H43813 Vitreous degeneration, bilateral: Secondary | ICD-10-CM | POA: Diagnosis not present

## 2016-11-01 DIAGNOSIS — G8929 Other chronic pain: Secondary | ICD-10-CM | POA: Diagnosis not present

## 2016-11-01 DIAGNOSIS — M25561 Pain in right knee: Secondary | ICD-10-CM | POA: Diagnosis not present

## 2016-11-07 DIAGNOSIS — Z0000 Encounter for general adult medical examination without abnormal findings: Secondary | ICD-10-CM | POA: Diagnosis not present

## 2016-11-07 DIAGNOSIS — M179 Osteoarthritis of knee, unspecified: Secondary | ICD-10-CM | POA: Diagnosis not present

## 2016-11-07 DIAGNOSIS — M899 Disorder of bone, unspecified: Secondary | ICD-10-CM | POA: Diagnosis not present

## 2016-11-07 DIAGNOSIS — I10 Essential (primary) hypertension: Secondary | ICD-10-CM | POA: Diagnosis not present

## 2016-11-07 DIAGNOSIS — Z1159 Encounter for screening for other viral diseases: Secondary | ICD-10-CM | POA: Diagnosis not present

## 2016-11-08 ENCOUNTER — Other Ambulatory Visit: Payer: Self-pay | Admitting: Family Medicine

## 2016-11-08 DIAGNOSIS — M8580 Other specified disorders of bone density and structure, unspecified site: Secondary | ICD-10-CM

## 2016-11-08 DIAGNOSIS — H43813 Vitreous degeneration, bilateral: Secondary | ICD-10-CM | POA: Diagnosis not present

## 2016-11-08 DIAGNOSIS — H353112 Nonexudative age-related macular degeneration, right eye, intermediate dry stage: Secondary | ICD-10-CM | POA: Diagnosis not present

## 2016-11-08 DIAGNOSIS — H353221 Exudative age-related macular degeneration, left eye, with active choroidal neovascularization: Secondary | ICD-10-CM | POA: Diagnosis not present

## 2016-11-16 ENCOUNTER — Ambulatory Visit
Admission: RE | Admit: 2016-11-16 | Discharge: 2016-11-16 | Disposition: A | Discharge status: Home / Self Care | Payer: Medicare Other | Source: Ambulatory Visit | Attending: Family Medicine | Admitting: Family Medicine

## 2016-11-16 DIAGNOSIS — M85852 Other specified disorders of bone density and structure, left thigh: Secondary | ICD-10-CM | POA: Diagnosis not present

## 2016-11-16 DIAGNOSIS — Z780 Asymptomatic menopausal state: Secondary | ICD-10-CM | POA: Diagnosis not present

## 2016-11-16 DIAGNOSIS — M8580 Other specified disorders of bone density and structure, unspecified site: Secondary | ICD-10-CM

## 2016-11-21 DIAGNOSIS — M1711 Unilateral primary osteoarthritis, right knee: Secondary | ICD-10-CM | POA: Diagnosis not present

## 2016-12-22 DIAGNOSIS — H43813 Vitreous degeneration, bilateral: Secondary | ICD-10-CM | POA: Diagnosis not present

## 2016-12-22 DIAGNOSIS — H353221 Exudative age-related macular degeneration, left eye, with active choroidal neovascularization: Secondary | ICD-10-CM | POA: Diagnosis not present

## 2016-12-22 DIAGNOSIS — H353112 Nonexudative age-related macular degeneration, right eye, intermediate dry stage: Secondary | ICD-10-CM | POA: Diagnosis not present

## 2016-12-29 DIAGNOSIS — M1711 Unilateral primary osteoarthritis, right knee: Secondary | ICD-10-CM | POA: Diagnosis not present

## 2016-12-29 DIAGNOSIS — S83231D Complex tear of medial meniscus, current injury, right knee, subsequent encounter: Secondary | ICD-10-CM | POA: Diagnosis not present

## 2017-01-26 DIAGNOSIS — Z961 Presence of intraocular lens: Secondary | ICD-10-CM | POA: Diagnosis not present

## 2017-01-30 DIAGNOSIS — H353221 Exudative age-related macular degeneration, left eye, with active choroidal neovascularization: Secondary | ICD-10-CM | POA: Diagnosis not present

## 2017-01-30 DIAGNOSIS — H353112 Nonexudative age-related macular degeneration, right eye, intermediate dry stage: Secondary | ICD-10-CM | POA: Diagnosis not present

## 2017-01-30 DIAGNOSIS — H43813 Vitreous degeneration, bilateral: Secondary | ICD-10-CM | POA: Diagnosis not present

## 2017-02-06 DIAGNOSIS — M23231 Derangement of other medial meniscus due to old tear or injury, right knee: Secondary | ICD-10-CM | POA: Diagnosis not present

## 2017-02-06 DIAGNOSIS — G8918 Other acute postprocedural pain: Secondary | ICD-10-CM | POA: Diagnosis not present

## 2017-02-06 DIAGNOSIS — M23331 Other meniscus derangements, other medial meniscus, right knee: Secondary | ICD-10-CM | POA: Diagnosis not present

## 2017-02-06 DIAGNOSIS — M958 Other specified acquired deformities of musculoskeletal system: Secondary | ICD-10-CM | POA: Diagnosis not present

## 2017-03-14 DIAGNOSIS — H353221 Exudative age-related macular degeneration, left eye, with active choroidal neovascularization: Secondary | ICD-10-CM | POA: Diagnosis not present

## 2017-03-14 DIAGNOSIS — H43813 Vitreous degeneration, bilateral: Secondary | ICD-10-CM | POA: Diagnosis not present

## 2017-03-14 DIAGNOSIS — H353112 Nonexudative age-related macular degeneration, right eye, intermediate dry stage: Secondary | ICD-10-CM | POA: Diagnosis not present

## 2017-04-23 DIAGNOSIS — H353112 Nonexudative age-related macular degeneration, right eye, intermediate dry stage: Secondary | ICD-10-CM | POA: Diagnosis not present

## 2017-04-23 DIAGNOSIS — H43813 Vitreous degeneration, bilateral: Secondary | ICD-10-CM | POA: Diagnosis not present

## 2017-04-23 DIAGNOSIS — H353221 Exudative age-related macular degeneration, left eye, with active choroidal neovascularization: Secondary | ICD-10-CM | POA: Diagnosis not present

## 2017-05-01 ENCOUNTER — Other Ambulatory Visit: Payer: Self-pay | Admitting: Family Medicine

## 2017-05-01 DIAGNOSIS — Z1231 Encounter for screening mammogram for malignant neoplasm of breast: Secondary | ICD-10-CM

## 2017-05-08 DIAGNOSIS — H43813 Vitreous degeneration, bilateral: Secondary | ICD-10-CM | POA: Diagnosis not present

## 2017-05-08 DIAGNOSIS — H353221 Exudative age-related macular degeneration, left eye, with active choroidal neovascularization: Secondary | ICD-10-CM | POA: Diagnosis not present

## 2017-05-08 DIAGNOSIS — H353112 Nonexudative age-related macular degeneration, right eye, intermediate dry stage: Secondary | ICD-10-CM | POA: Diagnosis not present

## 2017-05-18 ENCOUNTER — Ambulatory Visit
Admission: RE | Admit: 2017-05-18 | Discharge: 2017-05-18 | Disposition: A | Discharge status: Home / Self Care | Payer: Medicare Other | Source: Ambulatory Visit | Attending: Family Medicine | Admitting: Family Medicine

## 2017-05-18 DIAGNOSIS — Z1231 Encounter for screening mammogram for malignant neoplasm of breast: Secondary | ICD-10-CM | POA: Diagnosis not present

## 2017-06-15 DIAGNOSIS — L718 Other rosacea: Secondary | ICD-10-CM | POA: Diagnosis not present

## 2017-06-15 DIAGNOSIS — L820 Inflamed seborrheic keratosis: Secondary | ICD-10-CM | POA: Diagnosis not present

## 2017-06-15 DIAGNOSIS — L570 Actinic keratosis: Secondary | ICD-10-CM | POA: Diagnosis not present

## 2017-06-15 DIAGNOSIS — L821 Other seborrheic keratosis: Secondary | ICD-10-CM | POA: Diagnosis not present

## 2017-07-03 DIAGNOSIS — H353221 Exudative age-related macular degeneration, left eye, with active choroidal neovascularization: Secondary | ICD-10-CM | POA: Diagnosis not present

## 2017-07-03 DIAGNOSIS — H353112 Nonexudative age-related macular degeneration, right eye, intermediate dry stage: Secondary | ICD-10-CM | POA: Diagnosis not present

## 2017-07-03 DIAGNOSIS — H43813 Vitreous degeneration, bilateral: Secondary | ICD-10-CM | POA: Diagnosis not present

## 2017-08-13 DIAGNOSIS — Z01419 Encounter for gynecological examination (general) (routine) without abnormal findings: Secondary | ICD-10-CM | POA: Diagnosis not present

## 2017-08-28 DIAGNOSIS — H353112 Nonexudative age-related macular degeneration, right eye, intermediate dry stage: Secondary | ICD-10-CM | POA: Diagnosis not present

## 2017-08-28 DIAGNOSIS — H353221 Exudative age-related macular degeneration, left eye, with active choroidal neovascularization: Secondary | ICD-10-CM | POA: Diagnosis not present

## 2017-08-28 DIAGNOSIS — H43813 Vitreous degeneration, bilateral: Secondary | ICD-10-CM | POA: Diagnosis not present

## 2017-10-05 DIAGNOSIS — M7051 Other bursitis of knee, right knee: Secondary | ICD-10-CM | POA: Diagnosis not present

## 2017-10-05 DIAGNOSIS — M23231 Derangement of other medial meniscus due to old tear or injury, right knee: Secondary | ICD-10-CM | POA: Diagnosis not present

## 2017-10-05 DIAGNOSIS — Z9889 Other specified postprocedural states: Secondary | ICD-10-CM | POA: Diagnosis not present

## 2017-10-09 DIAGNOSIS — H43813 Vitreous degeneration, bilateral: Secondary | ICD-10-CM | POA: Diagnosis not present

## 2017-10-09 DIAGNOSIS — H353112 Nonexudative age-related macular degeneration, right eye, intermediate dry stage: Secondary | ICD-10-CM | POA: Diagnosis not present

## 2017-10-09 DIAGNOSIS — H353221 Exudative age-related macular degeneration, left eye, with active choroidal neovascularization: Secondary | ICD-10-CM | POA: Diagnosis not present

## 2017-11-16 DIAGNOSIS — I10 Essential (primary) hypertension: Secondary | ICD-10-CM | POA: Diagnosis not present

## 2017-11-16 DIAGNOSIS — Z0000 Encounter for general adult medical examination without abnormal findings: Secondary | ICD-10-CM | POA: Diagnosis not present

## 2017-11-16 DIAGNOSIS — M1711 Unilateral primary osteoarthritis, right knee: Secondary | ICD-10-CM | POA: Diagnosis not present

## 2017-11-16 DIAGNOSIS — M899 Disorder of bone, unspecified: Secondary | ICD-10-CM | POA: Diagnosis not present

## 2017-11-20 DIAGNOSIS — H43813 Vitreous degeneration, bilateral: Secondary | ICD-10-CM | POA: Diagnosis not present

## 2017-11-20 DIAGNOSIS — H353221 Exudative age-related macular degeneration, left eye, with active choroidal neovascularization: Secondary | ICD-10-CM | POA: Diagnosis not present

## 2017-11-20 DIAGNOSIS — H353113 Nonexudative age-related macular degeneration, right eye, advanced atrophic without subfoveal involvement: Secondary | ICD-10-CM | POA: Diagnosis not present

## 2017-11-22 DIAGNOSIS — M1711 Unilateral primary osteoarthritis, right knee: Secondary | ICD-10-CM | POA: Diagnosis not present

## 2017-11-22 DIAGNOSIS — M7051 Other bursitis of knee, right knee: Secondary | ICD-10-CM | POA: Diagnosis not present

## 2017-11-30 DIAGNOSIS — C44319 Basal cell carcinoma of skin of other parts of face: Secondary | ICD-10-CM | POA: Diagnosis not present

## 2017-11-30 DIAGNOSIS — D485 Neoplasm of uncertain behavior of skin: Secondary | ICD-10-CM | POA: Diagnosis not present

## 2017-12-20 DIAGNOSIS — L309 Dermatitis, unspecified: Secondary | ICD-10-CM | POA: Diagnosis not present

## 2017-12-20 DIAGNOSIS — L237 Allergic contact dermatitis due to plants, except food: Secondary | ICD-10-CM | POA: Diagnosis not present

## 2018-01-07 DIAGNOSIS — H5213 Myopia, bilateral: Secondary | ICD-10-CM | POA: Diagnosis not present

## 2018-01-08 DIAGNOSIS — H353113 Nonexudative age-related macular degeneration, right eye, advanced atrophic without subfoveal involvement: Secondary | ICD-10-CM | POA: Diagnosis not present

## 2018-01-08 DIAGNOSIS — H43813 Vitreous degeneration, bilateral: Secondary | ICD-10-CM | POA: Diagnosis not present

## 2018-01-08 DIAGNOSIS — H353221 Exudative age-related macular degeneration, left eye, with active choroidal neovascularization: Secondary | ICD-10-CM | POA: Diagnosis not present

## 2018-01-09 DIAGNOSIS — C44319 Basal cell carcinoma of skin of other parts of face: Secondary | ICD-10-CM | POA: Diagnosis not present

## 2018-01-15 DIAGNOSIS — M25561 Pain in right knee: Secondary | ICD-10-CM | POA: Diagnosis not present

## 2018-03-15 DIAGNOSIS — H353221 Exudative age-related macular degeneration, left eye, with active choroidal neovascularization: Secondary | ICD-10-CM | POA: Diagnosis not present

## 2018-03-15 DIAGNOSIS — H353113 Nonexudative age-related macular degeneration, right eye, advanced atrophic without subfoveal involvement: Secondary | ICD-10-CM | POA: Diagnosis not present

## 2018-03-15 DIAGNOSIS — H43813 Vitreous degeneration, bilateral: Secondary | ICD-10-CM | POA: Diagnosis not present

## 2018-04-05 ENCOUNTER — Other Ambulatory Visit: Payer: Self-pay | Admitting: Family Medicine

## 2018-04-05 DIAGNOSIS — Z1231 Encounter for screening mammogram for malignant neoplasm of breast: Secondary | ICD-10-CM

## 2018-05-20 ENCOUNTER — Ambulatory Visit
Admission: RE | Admit: 2018-05-20 | Discharge: 2018-05-20 | Disposition: A | Discharge status: Home / Self Care | Payer: Medicare Other | Source: Ambulatory Visit | Attending: Family Medicine | Admitting: Family Medicine

## 2018-05-20 DIAGNOSIS — Z1231 Encounter for screening mammogram for malignant neoplasm of breast: Secondary | ICD-10-CM

## 2018-06-04 DIAGNOSIS — H353221 Exudative age-related macular degeneration, left eye, with active choroidal neovascularization: Secondary | ICD-10-CM | POA: Diagnosis not present

## 2018-08-08 DIAGNOSIS — L570 Actinic keratosis: Secondary | ICD-10-CM | POA: Diagnosis not present

## 2018-08-08 DIAGNOSIS — L821 Other seborrheic keratosis: Secondary | ICD-10-CM | POA: Diagnosis not present

## 2018-08-08 DIAGNOSIS — L718 Other rosacea: Secondary | ICD-10-CM | POA: Diagnosis not present

## 2018-08-08 DIAGNOSIS — Z85828 Personal history of other malignant neoplasm of skin: Secondary | ICD-10-CM | POA: Diagnosis not present

## 2018-08-20 DIAGNOSIS — H353221 Exudative age-related macular degeneration, left eye, with active choroidal neovascularization: Secondary | ICD-10-CM | POA: Diagnosis not present

## 2018-10-17 DIAGNOSIS — M1711 Unilateral primary osteoarthritis, right knee: Secondary | ICD-10-CM | POA: Diagnosis not present

## 2018-10-17 DIAGNOSIS — M25561 Pain in right knee: Secondary | ICD-10-CM | POA: Diagnosis not present

## 2018-10-17 DIAGNOSIS — M545 Low back pain: Secondary | ICD-10-CM | POA: Diagnosis not present

## 2018-11-01 DIAGNOSIS — H43813 Vitreous degeneration, bilateral: Secondary | ICD-10-CM | POA: Diagnosis not present

## 2018-11-01 DIAGNOSIS — H353221 Exudative age-related macular degeneration, left eye, with active choroidal neovascularization: Secondary | ICD-10-CM | POA: Diagnosis not present

## 2018-11-01 DIAGNOSIS — H353113 Nonexudative age-related macular degeneration, right eye, advanced atrophic without subfoveal involvement: Secondary | ICD-10-CM | POA: Diagnosis not present

## 2018-11-19 DIAGNOSIS — M25561 Pain in right knee: Secondary | ICD-10-CM | POA: Diagnosis not present

## 2018-11-25 DIAGNOSIS — M25561 Pain in right knee: Secondary | ICD-10-CM | POA: Diagnosis not present

## 2018-11-29 DIAGNOSIS — M25561 Pain in right knee: Secondary | ICD-10-CM | POA: Diagnosis not present

## 2018-12-03 DIAGNOSIS — I10 Essential (primary) hypertension: Secondary | ICD-10-CM | POA: Diagnosis not present

## 2018-12-05 DIAGNOSIS — M25561 Pain in right knee: Secondary | ICD-10-CM | POA: Diagnosis not present

## 2018-12-06 DIAGNOSIS — Z0000 Encounter for general adult medical examination without abnormal findings: Secondary | ICD-10-CM | POA: Diagnosis not present

## 2018-12-06 DIAGNOSIS — M899 Disorder of bone, unspecified: Secondary | ICD-10-CM | POA: Diagnosis not present

## 2018-12-06 DIAGNOSIS — I10 Essential (primary) hypertension: Secondary | ICD-10-CM | POA: Diagnosis not present

## 2018-12-06 DIAGNOSIS — M179 Osteoarthritis of knee, unspecified: Secondary | ICD-10-CM | POA: Diagnosis not present

## 2018-12-10 DIAGNOSIS — M25561 Pain in right knee: Secondary | ICD-10-CM | POA: Diagnosis not present

## 2019-01-03 DIAGNOSIS — S76311D Strain of muscle, fascia and tendon of the posterior muscle group at thigh level, right thigh, subsequent encounter: Secondary | ICD-10-CM | POA: Diagnosis not present

## 2019-01-03 DIAGNOSIS — M25561 Pain in right knee: Secondary | ICD-10-CM | POA: Diagnosis not present

## 2019-01-08 DIAGNOSIS — H5213 Myopia, bilateral: Secondary | ICD-10-CM | POA: Diagnosis not present

## 2019-01-10 DIAGNOSIS — H353221 Exudative age-related macular degeneration, left eye, with active choroidal neovascularization: Secondary | ICD-10-CM | POA: Diagnosis not present

## 2019-01-10 DIAGNOSIS — H353113 Nonexudative age-related macular degeneration, right eye, advanced atrophic without subfoveal involvement: Secondary | ICD-10-CM | POA: Diagnosis not present

## 2019-01-10 DIAGNOSIS — H43813 Vitreous degeneration, bilateral: Secondary | ICD-10-CM | POA: Diagnosis not present

## 2019-01-10 DIAGNOSIS — H26493 Other secondary cataract, bilateral: Secondary | ICD-10-CM | POA: Diagnosis not present

## 2019-01-12 DIAGNOSIS — N3001 Acute cystitis with hematuria: Secondary | ICD-10-CM | POA: Diagnosis not present

## 2019-01-12 DIAGNOSIS — R3915 Urgency of urination: Secondary | ICD-10-CM | POA: Diagnosis not present

## 2019-04-01 DIAGNOSIS — H43813 Vitreous degeneration, bilateral: Secondary | ICD-10-CM | POA: Diagnosis not present

## 2019-04-01 DIAGNOSIS — H353114 Nonexudative age-related macular degeneration, right eye, advanced atrophic with subfoveal involvement: Secondary | ICD-10-CM | POA: Diagnosis not present

## 2019-04-01 DIAGNOSIS — H353221 Exudative age-related macular degeneration, left eye, with active choroidal neovascularization: Secondary | ICD-10-CM | POA: Diagnosis not present

## 2019-04-04 ENCOUNTER — Ambulatory Visit: Payer: Medicare Other | Attending: Internal Medicine

## 2019-04-04 DIAGNOSIS — Z23 Encounter for immunization: Secondary | ICD-10-CM | POA: Insufficient documentation

## 2019-04-04 NOTE — Progress Notes (Signed)
   Covid-19 Vaccination Clinic  Name:  Cathy Greene    MRN: [id number] DOB: 1945/07/21  04/04/2019  [name] was observed post Covid-19 immunization for 15 minutes without incidence. She was provided with Vaccine Information Sheet and instruction to access the V-Safe system.   [name] was instructed to call 911 with any severe reactions post vaccine: Marland Kitchen Difficulty breathing  . Swelling of your face and throat  . A fast heartbeat  . A bad rash all over your body  . Dizziness and weakness    Immunizations Administered    Name Date Dose VIS Date Route   Pfizer COVID-19 Vaccine 04/04/2019  9:21 AM 0.3 mL 02/21/2019 Intramuscular   Manufacturer: [employer], [employer]   Lot: [id number]   NDC: [address]-1

## 2019-04-24 ENCOUNTER — Ambulatory Visit: Payer: Medicare Other | Attending: Internal Medicine

## 2019-04-24 DIAGNOSIS — Z23 Encounter for immunization: Secondary | ICD-10-CM | POA: Insufficient documentation

## 2019-04-24 NOTE — Progress Notes (Signed)
   Covid-19 Vaccination Clinic  Name:  Cathy Greene    MRN: [id number] DOB: [birth year]  04/24/2019  Cathy Greene was observed post Covid-19 immunization for 15 minutes without incidence. She was provided with Vaccine Information Sheet and instruction to access the V-Safe system.   Cathy Greene was instructed to call 911 with any severe reactions post vaccine: Marland Kitchen Difficulty breathing  . Swelling of your face and throat  . A fast heartbeat  . A bad rash all over your body  . Dizziness and weakness    Immunizations Administered    Name Date Dose VIS Date Route   Pfizer COVID-19 Vaccine 04/24/2019  2:22 PM 0.3 mL 02/21/2019 Intramuscular   Manufacturer: [employer], [employer]   Lot: [id number]   NDC: [address]-4

## 2019-05-08 ENCOUNTER — Ambulatory Visit: Payer: Medicare Other

## 2019-05-14 ENCOUNTER — Other Ambulatory Visit: Payer: Self-pay | Admitting: Family Medicine

## 2019-05-14 DIAGNOSIS — Z1231 Encounter for screening mammogram for malignant neoplasm of breast: Secondary | ICD-10-CM

## 2019-06-16 ENCOUNTER — Other Ambulatory Visit: Payer: Self-pay

## 2019-06-16 ENCOUNTER — Ambulatory Visit
Admission: RE | Admit: 2019-06-16 | Discharge: 2019-06-16 | Disposition: A | Discharge status: Home / Self Care | Payer: Medicare Other | Source: Ambulatory Visit | Attending: Family Medicine | Admitting: Family Medicine

## 2019-06-16 DIAGNOSIS — Z1231 Encounter for screening mammogram for malignant neoplasm of breast: Secondary | ICD-10-CM | POA: Diagnosis not present

## 2019-07-08 DIAGNOSIS — H353221 Exudative age-related macular degeneration, left eye, with active choroidal neovascularization: Secondary | ICD-10-CM | POA: Diagnosis not present

## 2019-07-08 DIAGNOSIS — H43813 Vitreous degeneration, bilateral: Secondary | ICD-10-CM | POA: Diagnosis not present

## 2019-07-08 DIAGNOSIS — H353114 Nonexudative age-related macular degeneration, right eye, advanced atrophic with subfoveal involvement: Secondary | ICD-10-CM | POA: Diagnosis not present

## 2019-07-31 ENCOUNTER — Other Ambulatory Visit: Payer: Self-pay | Admitting: Family Medicine

## 2019-07-31 DIAGNOSIS — E2839 Other primary ovarian failure: Secondary | ICD-10-CM

## 2019-07-31 DIAGNOSIS — M1711 Unilateral primary osteoarthritis, right knee: Secondary | ICD-10-CM | POA: Diagnosis not present

## 2019-08-04 ENCOUNTER — Ambulatory Visit
Admission: RE | Admit: 2019-08-04 | Discharge: 2019-08-04 | Disposition: A | Discharge status: Home / Self Care | Payer: Medicare Other | Source: Ambulatory Visit | Attending: Family Medicine | Admitting: Family Medicine

## 2019-08-04 ENCOUNTER — Other Ambulatory Visit: Payer: Self-pay

## 2019-08-04 DIAGNOSIS — Z780 Asymptomatic menopausal state: Secondary | ICD-10-CM | POA: Diagnosis not present

## 2019-08-04 DIAGNOSIS — M85852 Other specified disorders of bone density and structure, left thigh: Secondary | ICD-10-CM | POA: Diagnosis not present

## 2019-08-04 DIAGNOSIS — E2839 Other primary ovarian failure: Secondary | ICD-10-CM

## 2019-08-12 DIAGNOSIS — Z85828 Personal history of other malignant neoplasm of skin: Secondary | ICD-10-CM | POA: Diagnosis not present

## 2019-08-12 DIAGNOSIS — D1801 Hemangioma of skin and subcutaneous tissue: Secondary | ICD-10-CM | POA: Diagnosis not present

## 2019-08-12 DIAGNOSIS — L821 Other seborrheic keratosis: Secondary | ICD-10-CM | POA: Diagnosis not present

## 2019-08-12 DIAGNOSIS — L570 Actinic keratosis: Secondary | ICD-10-CM | POA: Diagnosis not present

## 2019-08-14 DIAGNOSIS — Z0120 Encounter for dental examination and cleaning without abnormal findings: Secondary | ICD-10-CM | POA: Diagnosis not present

## 2019-08-20 DIAGNOSIS — Z01419 Encounter for gynecological examination (general) (routine) without abnormal findings: Secondary | ICD-10-CM | POA: Diagnosis not present

## 2019-10-12 DIAGNOSIS — Z20828 Contact with and (suspected) exposure to other viral communicable diseases: Secondary | ICD-10-CM | POA: Diagnosis not present

## 2019-10-15 DIAGNOSIS — Z03818 Encounter for observation for suspected exposure to other biological agents ruled out: Secondary | ICD-10-CM | POA: Diagnosis not present

## 2019-10-21 DIAGNOSIS — H353114 Nonexudative age-related macular degeneration, right eye, advanced atrophic with subfoveal involvement: Secondary | ICD-10-CM | POA: Diagnosis not present

## 2019-10-21 DIAGNOSIS — H353222 Exudative age-related macular degeneration, left eye, with inactive choroidal neovascularization: Secondary | ICD-10-CM | POA: Diagnosis not present

## 2019-10-21 DIAGNOSIS — H43813 Vitreous degeneration, bilateral: Secondary | ICD-10-CM | POA: Diagnosis not present

## 2019-10-24 NOTE — Patient Instructions (Addendum)
DUE TO COVID-19 ONLY ONE VISITOR IS ALLOWED TO COME WITH YOU AND STAY IN THE WAITING ROOM ONLY DURING PRE OP AND PROCEDURE DAY OF SURGERY. THE 1 VISITOR  MAY VISIT WITH YOU AFTER SURGERY IN YOUR PRIVATE ROOM DURING VISITING HOURS ONLY!  YOU NEED TO HAVE A COVID 19 TEST ON__8/26_____ @_9 :00______, THIS TEST MUST BE DONE BEFORE SURGERY,  COVID TESTING SITE [address] [place] , IT IS ON THE RIGHT GOING OUT [address] APPROXIMATELY  2 MINUTES PAST [employer] ON THE RIGHT. ONCE YOUR COVID TEST IS COMPLETED,  PLEASE BEGIN THE QUARANTINE INSTRUCTIONS AS OUTLINED IN YOUR HANDOUT.                Cathy Greene    Your procedure is scheduled on: 8/30   Report to [facility] Main  Entrance   Report to admitting at 5:50 AM     Call this number if you have problems the morning of surgery [phone]   . BRUSH YOUR TEETH MORNING OF SURGERY AND RINSE YOUR MOUTH OUT, NO CHEWING GUM CANDY OR MINTS.    No food after midnight.    You may have clear liquid until 5:00 AM.    At 5:00 AM drink pre surgery drink.   Nothing by mouth after 5:00 AM.   Take these medicines the morning of surgery with A SIP OF WATER: none                                 You may not have any metal on your body including hair pins and              piercings  Do not wear jewelry, make-up, lotions, powders or perfumes, deodorant             Do not wear nail polish on your fingernails.  Do not shave  48 hours prior to surgery.              .   Do not bring valuables to the hospital. [facility] IS NOT             RESPONSIBLE   FOR VALUABLES.  Contacts, dentures or bridgework may not be worn into surgery.      Name and phone number of your driver:  Special Instructions: N/A              Please read over the following fact sheets you were given: _____________________________________________________________________             [facility] - Preparing for Surgery  Before  surgery, you can play an important role.   Because skin is not sterile, your skin needs to be as free of germs as possible.   You can reduce the number of germs on your skin by washing with CHG (chlorahexidine gluconate) soap before surgery.   CHG is an antiseptic cleaner which kills germs and bonds with the skin to continue killing germs even after washing. Please DO NOT use if you have an allergy to CHG or antibacterial soaps.   If your skin becomes reddened/irritated stop using the CHG and inform your nurse when you arrive at Short Stay. Do not shave (including legs and underarms) for at least 48 hours prior to the first CHG shower.    Please follow these instructions carefully:  1.  Shower with CHG Soap the night before surgery and  the  morning of Surgery.  2.  If you choose to wash your hair, wash your hair first as usual with your  normal  shampoo.  3.  After you shampoo, rinse your hair and body thoroughly to remove the  shampoo.                                        4.  Use CHG as you would any other liquid soap.  You can apply chg directly  to the skin and wash                       Gently with a scrungie or clean washcloth.  5.  Apply the CHG Soap to your body ONLY FROM THE NECK DOWN.   Do not use on face/ open                           Wound or open sores. Avoid contact with eyes, ears mouth and genitals (private parts).                       Wash face,  Genitals (private parts) with your normal soap.             6.  Wash thoroughly, paying special attention to the area where your surgery  will be performed.  7.  Thoroughly rinse your body with warm water from the neck down.  8.  DO NOT shower/wash with your normal soap after using and rinsing off  the CHG Soap.             9.  Pat yourself dry with a clean towel.            10.  Wear clean pajamas.            11.  Place clean sheets on your bed the night of your first shower and do not  sleep with pets. Day of Surgery : Do not  apply any lotions/deodorants the morning of surgery.  Please wear clean clothes to the hospital/surgery center.  FAILURE TO FOLLOW THESE INSTRUCTIONS MAY RESULT IN THE CANCELLATION OF YOUR SURGERY PATIENT SIGNATURE_________________________________  NURSE SIGNATURE__________________________________  ________________________________________________________________________   Rogelia Mire  An incentive spirometer is a tool that can help keep your lungs clear and active. This tool measures how well you are filling your lungs with each breath. Taking long deep breaths may help reverse or decrease the chance of developing breathing (pulmonary) problems (especially infection) following:  A long period of time when you are unable to move or be active. BEFORE THE PROCEDURE   If the spirometer includes an indicator to show your best effort, your nurse or respiratory therapist will set it to a desired goal.  If possible, sit up straight or lean slightly forward. Try not to slouch.  Hold the incentive spirometer in an upright position. INSTRUCTIONS FOR USE  1. Sit on the edge of your bed if possible, or sit up as far as you can in bed or on a chair. 2. Hold the incentive spirometer in an upright position. 3. Breathe out normally. 4. Place the mouthpiece in your mouth and seal your lips tightly around it. 5. Breathe in slowly and as deeply as possible, raising the piston or the ball toward the top of the column. 6.  Hold your breath for 3-5 seconds or for as long as possible. Allow the piston or ball to fall to the bottom of the column. 7. Remove the mouthpiece from your mouth and breathe out normally. 8. Rest for a few seconds and repeat Steps 1 through 7 at least 10 times every 1-2 hours when you are awake. Take your time and take a few normal breaths between deep breaths. 9. The spirometer may include an indicator to show your best effort. Use the indicator as a goal to work toward during  each repetition. 10. After each set of 10 deep breaths, practice coughing to be sure your lungs are clear. If you have an incision (the cut made at the time of surgery), support your incision when coughing by placing a pillow or rolled up towels firmly against it. Once you are able to get out of bed, walk around indoors and cough well. You may stop using the incentive spirometer when instructed by your caregiver.  RISKS AND COMPLICATIONS  Take your time so you do not get dizzy or light-headed.  If you are in pain, you may need to take or ask for pain medication before doing incentive spirometry. It is harder to take a deep breath if you are having pain. AFTER USE  Rest and breathe slowly and easily.  It can be helpful to keep track of a log of your progress. Your caregiver can provide you with a simple table to help with this. If you are using the spirometer at home, follow these instructions: SEEK MEDICAL CARE IF:   You are having difficultly using the spirometer.  You have trouble using the spirometer as often as instructed.  Your pain medication is not giving enough relief while using the spirometer.  You develop fever of 100.5 F (38.1 C) or higher. SEEK IMMEDIATE MEDICAL CARE IF:   You cough up bloody sputum that had not been present before.  You develop fever of 102 F (38.9 C) or greater.  You develop worsening pain at or near the incision site. MAKE SURE YOU:   Understand these instructions.  Will watch your condition.  Will get help right away if you are not doing well or get worse. Document Released: 07/10/2006 Document Revised: 05/22/2011 Document Reviewed: 09/10/2006 [facility] Patient Information 2014 [place], [place].   ________________________________________________________________________

## 2019-11-01 DIAGNOSIS — M1711 Unilateral primary osteoarthritis, right knee: Secondary | ICD-10-CM | POA: Diagnosis not present

## 2019-11-01 DIAGNOSIS — N390 Urinary tract infection, site not specified: Secondary | ICD-10-CM | POA: Diagnosis not present

## 2019-11-01 DIAGNOSIS — R300 Dysuria: Secondary | ICD-10-CM | POA: Diagnosis not present

## 2019-11-04 ENCOUNTER — Other Ambulatory Visit: Payer: Self-pay

## 2019-11-04 ENCOUNTER — Encounter (HOSPITAL_COMMUNITY)
Admission: RE | Admit: 2019-11-04 | Discharge: 2019-11-04 | Disposition: A | Discharge status: Home / Self Care | Payer: Medicare Other | Source: Ambulatory Visit | Attending: Orthopedic Surgery | Admitting: Orthopedic Surgery

## 2019-11-04 ENCOUNTER — Encounter (HOSPITAL_COMMUNITY): Payer: Self-pay

## 2019-11-04 DIAGNOSIS — Z01812 Encounter for preprocedural laboratory examination: Secondary | ICD-10-CM | POA: Diagnosis not present

## 2019-11-04 DIAGNOSIS — Z01810 Encounter for preprocedural cardiovascular examination: Secondary | ICD-10-CM | POA: Diagnosis not present

## 2019-11-04 DIAGNOSIS — I10 Essential (primary) hypertension: Secondary | ICD-10-CM | POA: Diagnosis not present

## 2019-11-04 LAB — PROTIME-INR
INR: 1 (ref 0.8–1.2)
Prothrombin Time: 12.8 seconds (ref 11.4–15.2)

## 2019-11-04 LAB — COMPREHENSIVE METABOLIC PANEL
ALT: 17 U/L (ref 0–44)
AST: 20 U/L (ref 15–41)
Albumin: 3.8 g/dL (ref 3.5–5.0)
Alkaline Phosphatase: 76 U/L (ref 38–126)
Anion gap: 6 (ref 5–15)
BUN: 11 mg/dL (ref 8–23)
CO2: 27 mmol/L (ref 22–32)
Calcium: 8.8 mg/dL — ABNORMAL LOW (ref 8.9–10.3)
Chloride: 102 mmol/L (ref 98–111)
Creatinine, Ser: 0.55 mg/dL (ref 0.44–1.00)
GFR calc Af Amer: >60 mL/min (ref >60)
GFR calc non Af Amer: >60 mL/min (ref >60)
Glucose, Bld: 95 mg/dL (ref 70–99)
Potassium: 3.9 mmol/L (ref 3.5–5.1)
Sodium: 135 mmol/L (ref 135–145)
Total Bilirubin: 0.7 mg/dL (ref 0.3–1.2)
Total Protein: 6.5 g/dL (ref 6.5–8.1)

## 2019-11-04 LAB — CBC
HCT: 40 % (ref 36.0–46.0)
Hemoglobin: 13.2 g/dL (ref 12.0–15.0)
MCH: 29.7 pg (ref 26.0–34.0)
MCHC: 33 g/dL (ref 30.0–36.0)
MCV: 90.1 fL (ref 80.0–100.0)
Platelets: 211 10*3/uL (ref 150–400)
RBC: 4.44 MIL/uL (ref 3.87–5.11)
RDW: 14 % (ref 11.5–15.5)
WBC: 5.3 10*3/uL (ref 4.0–10.5)
nRBC: 0 % (ref 0.0–0.2)

## 2019-11-04 LAB — SURGICAL PCR SCREEN
MRSA, PCR: NEGATIVE
Staphylococcus aureus: NEGATIVE

## 2019-11-04 LAB — APTT: aPTT: 27 seconds (ref 24–36)

## 2019-11-04 NOTE — Progress Notes (Signed)
COVID Vaccine Completed: Yes Date COVID Vaccine completed: 04/24/19 COVID vaccine manufacturer: Pfizer      PCP - Dr. Kirtland Bouchard. [facility] Cardiologist - Dr. P. Sw[place]and  Chest x-ray - no EKG - 11/04/19 Stress Test - 2012 ECHO - 2017 Cardiac Cath - 2018  Sleep Study - no CPAP -   Fasting Blood Sugar - NA Checks Blood Sugar _____ times a day  Blood Thinner Instructions:NA Aspirin Instructions: Last Dose:  Anesthesia review:   Patient denies shortness of breath, fever, cough and chest pain at PAT appointment yes   Patient verbalized understanding of instructions that were given to them at the PAT appointment. Patient was also instructed that they will need to review over the PAT instructions again at home before surgery. Yes  The Pt doesn't climb stairs but she has no SOB doing house work or with ADLs Card cath in 2018 was mild CAD, EF 55-56% prox LAD 30% stenosed

## 2019-11-06 ENCOUNTER — Other Ambulatory Visit (HOSPITAL_COMMUNITY)
Admission: RE | Admit: 2019-11-06 | Discharge: 2019-11-06 | Disposition: A | Discharge status: Home / Self Care | Payer: Medicare Other | Source: Ambulatory Visit | Attending: Orthopedic Surgery | Admitting: Orthopedic Surgery

## 2019-11-06 DIAGNOSIS — Z20822 Contact with and (suspected) exposure to covid-19: Secondary | ICD-10-CM | POA: Diagnosis not present

## 2019-11-06 DIAGNOSIS — Z01812 Encounter for preprocedural laboratory examination: Secondary | ICD-10-CM | POA: Insufficient documentation

## 2019-11-06 LAB — SARS CORONAVIRUS 2 (TAT 6-24 HRS): SARS Coronavirus 2: NEGATIVE

## 2019-11-09 MED ORDER — BUPIVACAINE LIPOSOME 1.3 % IJ SUSP
20.0000 mL | Freq: Once | INTRAMUSCULAR | Status: DC
  Filled 2019-11-09: qty 20

## 2019-11-09 NOTE — Anesthesia Preprocedure Evaluation (Addendum)
Anesthesia Evaluation  Patient identified by MRN, date of birth, ID band  Reviewed: Allergy & Precautions, NPO status , Patient's Chart, lab work & pertinent test results  History of Anesthesia Complications (+) PONV and history of anesthetic complications  Airway Mallampati: II  TM Distance: >3 FB Neck ROM: Full    Dental  (+) Dental Advisory Given, Teeth Intact, Chipped   Pulmonary    Pulmonary exam normal breath sounds clear to auscultation       Cardiovascular hypertension, Pt. on medications + angina Normal cardiovascular exam+ Valvular Problems/Murmurs  Rhythm:Regular Rate:Normal     Neuro/Psych    GI/Hepatic   Endo/Other    Renal/GU      Musculoskeletal  (+) Arthritis ,   Abdominal   Peds  Hematology   Anesthesia Other Findings   Reproductive/Obstetrics                            Anesthesia Physical  Anesthesia Plan  ASA: II  Anesthesia Plan: Spinal   Post-op Pain Management:  Regional for Post-op pain   Induction: Intravenous  PONV Risk Score and Plan: 4 or greater and Ondansetron, Propofol infusion, Treatment may vary due to age or medical condition and TIVA  Airway Management Planned: Natural Airway  Additional Equipment: None  Intra-op Plan:   Post-operative Plan:   Informed Consent: I have reviewed the patients History and Physical, chart, labs and discussed the procedure including the risks, benefits and alternatives for the proposed anesthesia with the patient or authorized representative who has indicated his/her understanding and acceptance.     Dental advisory given  Plan Discussed with: CRNA  Anesthesia Plan Comments:       Anesthesia Quick Evaluation

## 2019-11-09 NOTE — H&P (Signed)
TOTAL KNEE ADMISSION H&P  Patient is being admitted for right total knee arthroplasty.  Subjective:  Chief Complaint: Right knee pain.  HPI: Cathy Greene, 74 y.o. female has a history of pain and functional disability in the right knee due to arthritis and has failed non-surgical conservative treatments for greater than 12 weeks to include NSAID's and/or analgesics and activity modification. Onset of symptoms was gradual, starting several years ago with gradually worsening course since that time. The patient noted no past surgery on the right knee.  Patient currently rates pain in the right knee at 7 out of 10 with activity. Patient has night pain, worsening of pain with activity and weight bearing and crepitus. Patient has evidence of periarticular osteophytes and joint space narrowing by imaging studies. There is no active infection.  Patient Active Problem List   Diagnosis Date Noted  . OA (osteoarthritis) of knee 06/01/2014  . Chest pain 08/30/2010  . HTN (hypertension) 08/30/2010  . Hypercholesteremia 08/30/2010    Past Medical History:  Diagnosis Date  . Abnormal uterine bleeding   . Anginal pain (HCC) 2012  . Arthritis FINGERS AND KNEES  . Chest pain, exertional    a. ETT-Myoview 6/12: no ischemia, EF 66%;  b.  ETT-Myoview 6/14: No scar or ischemia, EF 69%  . Heart murmur   . History of kidney stones dec 2013  . Hypertension   . Osteopenia   . PONV (postoperative nausea and vomiting) 1979   with laparoscopy  . Rosacea     Past Surgical History:  Procedure Laterality Date  . BENIGN BREAST BX    . BREAST EXCISIONAL BIOPSY Right   . CARDIAC CATHETERIZATION N/A 03/24/2016   Procedure: Left Heart Cath and Coronary Angiography;  Surgeon: Peter M Swaziland, MD;  Location: [facility] INVASIVE CV LAB;  Service: Cardiovascular;  Laterality: N/A;  . CESAREAN SECTION     X3   LAST ONE 1984  W/ TUBAL LIGATION  . LAPAROSCOPY  1979  . TONSILLECTOMY AND ADENOIDECTOMY  AGE 67  . TOTAL KNEE  ARTHROPLASTY Left 06/01/2014   Procedure: LEFT TOTAL KNEE ARTHROPLASTY;  Surgeon: Ollen Gross, MD;  Location: [facility] ORS;  Service: Orthopedics;  Laterality: Left;  . uterine fibroid removed  2013    Prior to Admission medications   Medication Sig Start Date End Date Taking? Authorizing Provider  Calcium-Phosphorus-Vitamin D (CITRACAL +D3 PO) Take 2 capsules by mouth 2 (two) times daily.    Yes [provider]  lisinopril-hydrochlorothiazide (PRINZIDE,ZESTORETIC) 20-25 MG per tablet HOLD UNTIL FURTHER NOTIFIED BY CARDIOLOGY Patient taking differently: Take 0.5 tablets by mouth every morning.  08/20/12  Yes Weaver, Scott T, PA-C  Multiple Vitamins-Minerals (PRESERVISION AREDS 2) CAPS Take 1 tablet by mouth 2 (two) times daily.    Yes [provider]  raloxifene (EVISTA) 60 MG tablet Take 60 mg by mouth daily. 09/05/19  Yes [provider]  meclizine (ANTIVERT) 25 MG tablet Take 25 mg by mouth 3 (three) times daily as needed for dizziness. Patient not taking: Reported on 10/21/2019 01/06/16   [provider]    Allergies  Allergen Reactions  . Codeine Other (See Comments)    "HYPER"  . Oxycodone Nausea Only    Social History   Socioeconomic History  . Marital status: Married    Spouse name: Not on file  . Number of children: 3  . Years of education: Not on file  . Highest education level: Not on file  Occupational History  . Occupation: pre  school/ medical technology    Employer: RETIRED  Tobacco Use  . Smoking status: Never Smoker  . Smokeless tobacco: Never Used  Vaping Use  . Vaping Use: Never used  Substance and Sexual Activity  . Alcohol use: No  . Drug use: No  . Sexual activity: Not on file  Other Topics Concern  . Not on file  Social History Narrative  . Not on file   Social Determinants of Health   Financial Resource Strain:   . Difficulty of Paying Living Expenses: Not on file  Food Insecurity:   . Worried About Brewing technologist in the Last Year: Not on file  . Ran Out of Food in the Last Year: Not on file  Transportation Needs:   . Lack of Transportation (Medical): Not on file  . Lack of Transportation (Non-Medical): Not on file  Physical Activity:   . Days of Exercise per Week: Not on file  . Minutes of Exercise per Session: Not on file  Stress:   . Feeling of Stress : Not on file  Social Connections:   . Frequency of Communication with Friends and Family: Not on file  . Frequency of Social Gatherings with Friends and Family: Not on file  . Attends Religious Services: Not on file  . Active Member of Clubs or Organizations: Not on file  . Attends Banker Meetings: Not on file  . Marital Status: Not on file  Intimate Partner Violence:   . Fear of Current or Ex-Partner: Not on file  . Emotionally Abused: Not on file  . Physically Abused: Not on file  . Sexually Abused: Not on file      Tobacco Use: Low Risk   . Smoking Tobacco Use: Never Smoker  . Smokeless Tobacco Use: Never Used   Social History   Substance and Sexual Activity  Alcohol Use No    Family History  Problem Relation Age of Onset  . Lung cancer Father   . Cancer Father   . Hypertension Mother   . Arthritis Mother   . Hypertension Sister   . Arthritis Sister   . Asthma Sister   . Diabetes Maternal Grandmother   . Breast cancer Maternal Aunt   . Heart attack Neg Hx     Review of Systems  Constitutional: Negative for chills and fever.  HENT: Negative for congestion, sore throat and tinnitus.   Eyes: Negative for double vision, photophobia and pain.  Respiratory: Negative for cough, shortness of breath and wheezing.   Cardiovascular: Negative for chest pain, palpitations and orthopnea.  Gastrointestinal: Negative for heartburn, nausea and vomiting.  Genitourinary: Negative for dysuria, frequency and urgency.  Musculoskeletal: Positive for joint pain.  Neurological: Negative for dizziness, weakness and  headaches.    Objective:  Physical Exam: Well nourished and well developed. General: Alert and oriented x3, cooperative and pleasant, no acute distress. Head: normocephalic, atraumatic, neck supple. Eyes: EOMI. Respiratory: breath sounds clear in all fields, no wheezing, rales, or rhonchi. Cardiovascular: Regular rate and rhythm, no murmurs, gallops or rubs.  Abdomen: non-tender to palpation and soft, normoactive bowel sounds. Musculoskeletal:  Right knee exam:  Patient has well healed incisions, no erythema, no exudate, dry, and nontender.  There is no swelling/effusion.  Significant crepitus during range of motion.  Significant medial tenderness.  Some lateral tenderness.  The range of motion: 5 to 130 degrees.  The knee is stable.  Calves soft and nontender. Motor function intact in LE. Strength 5/5  LE bilaterally. Neuro: Distal pulses 2+. Sensation to light touch intact in LE.  Imaging Review Plain radiographs demonstrate severe degenerative joint disease of the right knee. The overall alignment is neutral. The bone quality appears to be adequate for age and reported activity level.  Assessment/Plan:  End stage arthritis, right knee   The patient history, physical examination, clinical judgment of the provider and imaging studies are consistent with end stage degenerative joint disease of the right knee and total knee arthroplasty is deemed medically necessary. The treatment options including medical management, injection therapy arthroscopy and arthroplasty were discussed at length. The risks and benefits of total knee arthroplasty were presented and reviewed. The risks due to aseptic loosening, infection, stiffness, patella tracking problems, thromboembolic complications and other imponderables were discussed. The patient acknowledged the explanation, agreed to proceed with the plan and consent was signed. Patient is being admitted for inpatient treatment for surgery, pain  control, PT, OT, prophylactic antibiotics, VTE prophylaxis, progressive ambulation and ADLs and discharge planning. The patient is planning to be discharged home.   Patient's anticipated LOS is less than 2 midnights, meeting these requirements: - Lives within 1 hour of care - Has a competent adult at home to recover with post-op recover - NO history of  - Chronic pain requiring opiods  - Diabetes  - Coronary Artery Disease  - Heart failure  - Heart attack  - Stroke  - DVT/VTE  - Cardiac arrhythmia  - Respiratory Failure/COPD  - Renal failure  - Anemia  - Advanced Liver disease  Therapy Plans: outpatient therapy at [facility] Disposition: Home with husband or daughter Planned DVT Prophylaxis: aspirin 325mg  BID DME needed: none PCP: Dr. , clearance received TXA: IV Allergies: Oxycodone - nausea Anesthesia Concerns: nausea/vomiting BMI: 29 Not diabetic.   Other: Used Tramadol with the previous TKA in 2016. Patient to hold Raloxifene 7 days prior and 30 days after surgery per Dr. 2017.   - Patient was instructed on what medications to stop prior to surgery. - Follow-up visit in 2 weeks with Dr. Clelia Croft - Begin physical therapy following surgery - Pre-operative lab work as pre-surgical testing - Prescriptions will be provided in hospital at time of discharge  Lequita Halt, PA-C Orthopedic Surgery [facility] [place]

## 2019-11-10 ENCOUNTER — Ambulatory Visit (HOSPITAL_COMMUNITY): Payer: Medicare Other | Admitting: Physician Assistant

## 2019-11-10 ENCOUNTER — Other Ambulatory Visit: Payer: Self-pay

## 2019-11-10 ENCOUNTER — Encounter (HOSPITAL_COMMUNITY)
Admission: RE | Disposition: A | Discharge status: Home / Self Care | Payer: Self-pay | Source: Home / Self Care | Attending: Orthopedic Surgery

## 2019-11-10 ENCOUNTER — Inpatient Hospital Stay (HOSPITAL_COMMUNITY)
Admission: RE | Admit: 2019-11-10 | Discharge: 2019-11-10 | DRG: 470 | Disposition: A | Discharge status: Home / Self Care | Payer: Medicare Other | Attending: Orthopedic Surgery | Admitting: Orthopedic Surgery

## 2019-11-10 ENCOUNTER — Ambulatory Visit (HOSPITAL_COMMUNITY): Payer: Medicare Other | Admitting: Anesthesiology

## 2019-11-10 DIAGNOSIS — M8580 Other specified disorders of bone density and structure, unspecified site: Secondary | ICD-10-CM | POA: Diagnosis present

## 2019-11-10 DIAGNOSIS — G8918 Other acute postprocedural pain: Secondary | ICD-10-CM | POA: Diagnosis not present

## 2019-11-10 DIAGNOSIS — Z885 Allergy status to narcotic agent status: Secondary | ICD-10-CM | POA: Diagnosis not present

## 2019-11-10 DIAGNOSIS — M1711 Unilateral primary osteoarthritis, right knee: Principal | ICD-10-CM | POA: Diagnosis present

## 2019-11-10 DIAGNOSIS — I10 Essential (primary) hypertension: Secondary | ICD-10-CM | POA: Diagnosis present

## 2019-11-10 DIAGNOSIS — M179 Osteoarthritis of knee, unspecified: Secondary | ICD-10-CM | POA: Diagnosis present

## 2019-11-10 DIAGNOSIS — L719 Rosacea, unspecified: Secondary | ICD-10-CM | POA: Diagnosis not present

## 2019-11-10 DIAGNOSIS — Z79899 Other long term (current) drug therapy: Secondary | ICD-10-CM | POA: Diagnosis not present

## 2019-11-10 DIAGNOSIS — Z8249 Family history of ischemic heart disease and other diseases of the circulatory system: Secondary | ICD-10-CM

## 2019-11-10 DIAGNOSIS — Z96653 Presence of artificial knee joint, bilateral: Secondary | ICD-10-CM | POA: Diagnosis not present

## 2019-11-10 DIAGNOSIS — Z803 Family history of malignant neoplasm of breast: Secondary | ICD-10-CM

## 2019-11-10 DIAGNOSIS — Z833 Family history of diabetes mellitus: Secondary | ICD-10-CM

## 2019-11-10 DIAGNOSIS — Z20822 Contact with and (suspected) exposure to covid-19: Secondary | ICD-10-CM | POA: Diagnosis present

## 2019-11-10 DIAGNOSIS — Z801 Family history of malignant neoplasm of trachea, bronchus and lung: Secondary | ICD-10-CM

## 2019-11-10 DIAGNOSIS — E7800 Pure hypercholesterolemia, unspecified: Secondary | ICD-10-CM | POA: Diagnosis not present

## 2019-11-10 DIAGNOSIS — Z825 Family history of asthma and other chronic lower respiratory diseases: Secondary | ICD-10-CM | POA: Diagnosis not present

## 2019-11-10 DIAGNOSIS — M1710 Unilateral primary osteoarthritis, unspecified knee: Secondary | ICD-10-CM | POA: Diagnosis present

## 2019-11-10 DIAGNOSIS — Z8261 Family history of arthritis: Secondary | ICD-10-CM | POA: Diagnosis not present

## 2019-11-10 HISTORY — PX: TOTAL KNEE ARTHROPLASTY: SHX125

## 2019-11-10 LAB — TYPE AND SCREEN
ABO/RH(D): O POS
Antibody Screen: NEGATIVE

## 2019-11-10 SURGERY — ARTHROPLASTY, KNEE, TOTAL
Anesthesia: Spinal | Site: Knee | Laterality: Right

## 2019-11-10 MED ORDER — CHLORHEXIDINE GLUCONATE 0.12 % MT SOLN
15.0000 mL | Freq: Once | OROMUCOSAL | Status: AC
  Administered 2019-11-10: 15 mL via OROMUCOSAL

## 2019-11-10 MED ORDER — CEFAZOLIN SODIUM-DEXTROSE 2-4 GM/100ML-% IV SOLN
2.0000 g | INTRAVENOUS | Status: AC
  Administered 2019-11-10: 2 g via INTRAVENOUS

## 2019-11-10 MED ORDER — METHOCARBAMOL 500 MG PO TABS: 500.0000 mg | ORAL_TABLET | Freq: Four times a day (QID) | ORAL | 0 refills | Status: AC | PRN

## 2019-11-10 MED ORDER — ONDANSETRON HCL 4 MG/2ML IJ SOLN
INTRAMUSCULAR | Status: AC
  Filled 2019-11-10: qty 2

## 2019-11-10 MED ORDER — ONDANSETRON HCL 4 MG/2ML IJ SOLN: 4.0000 mg | Freq: Once | INTRAMUSCULAR | Status: DC | PRN

## 2019-11-10 MED ORDER — PHENYLEPHRINE 40 MCG/ML (10ML) SYRINGE FOR IV PUSH (FOR BLOOD PRESSURE SUPPORT)
PREFILLED_SYRINGE | INTRAVENOUS | Status: AC
  Filled 2019-11-10: qty 10

## 2019-11-10 MED ORDER — BUPIVACAINE-EPINEPHRINE (PF) 0.5% -1:200000 IJ SOLN
INTRAMUSCULAR | Status: DC | PRN
  Administered 2019-11-10: 30 mL

## 2019-11-10 MED ORDER — DEXAMETHASONE SODIUM PHOSPHATE 4 MG/ML IJ SOLN
INTRAMUSCULAR | Status: DC | PRN
  Administered 2019-11-10: 5 mg via PERINEURAL
  Administered 2019-11-10: 8 mg via PERINEURAL

## 2019-11-10 MED ORDER — PROPOFOL 500 MG/50ML IV EMUL
INTRAVENOUS | Status: DC | PRN
  Administered 2019-11-10: 100 ug/kg/min via INTRAVENOUS

## 2019-11-10 MED ORDER — BUPIVACAINE LIPOSOME 1.3 % IJ SUSP
INTRAMUSCULAR | Status: DC | PRN
  Administered 2019-11-10: 20 mL

## 2019-11-10 MED ORDER — POVIDONE-IODINE 10 % EX SWAB
2.0000 "application " | Freq: Once | CUTANEOUS | Status: AC
  Administered 2019-11-10: 2 via TOPICAL

## 2019-11-10 MED ORDER — BUPIVACAINE IN DEXTROSE 0.75-8.25 % IT SOLN
INTRATHECAL | Status: DC | PRN
  Administered 2019-11-10: 1.6 mL via INTRATHECAL

## 2019-11-10 MED ORDER — HYDROMORPHONE HCL 2 MG PO TABS: 2.0000 mg | ORAL_TABLET | ORAL | Status: DC | PRN

## 2019-11-10 MED ORDER — ACETAMINOPHEN 10 MG/ML IV SOLN: 1000.0000 mg | Freq: Four times a day (QID) | INTRAVENOUS | Status: DC

## 2019-11-10 MED ORDER — TRANEXAMIC ACID-NACL 1000-0.7 MG/100ML-% IV SOLN
1000.0000 mg | INTRAVENOUS | Status: AC
  Administered 2019-11-10: 1000 mg via INTRAVENOUS

## 2019-11-10 MED ORDER — SODIUM CHLORIDE 0.9 % IR SOLN
Status: DC | PRN
  Administered 2019-11-10: 1000 mL

## 2019-11-10 MED ORDER — GABAPENTIN 300 MG PO CAPS: ORAL_CAPSULE | ORAL | 0 refills | Status: AC

## 2019-11-10 MED ORDER — ORAL CARE MOUTH RINSE: 15.0000 mL | Freq: Once | OROMUCOSAL | Status: AC

## 2019-11-10 MED ORDER — HYDROMORPHONE HCL 1 MG/ML IJ SOLN: 0.2500 mg | INTRAMUSCULAR | Status: DC | PRN

## 2019-11-10 MED ORDER — 0.9 % SODIUM CHLORIDE (POUR BTL) OPTIME
TOPICAL | Status: DC | PRN
  Administered 2019-11-10: 1000 mL

## 2019-11-10 MED ORDER — TRAMADOL HCL 50 MG PO TABS: 50.0000 mg | ORAL_TABLET | Freq: Four times a day (QID) | ORAL | 0 refills | Status: AC | PRN

## 2019-11-10 MED ORDER — CLONIDINE HCL (ANALGESIA) 100 MCG/ML EP SOLN
EPIDURAL | Status: DC | PRN
  Administered 2019-11-10: 75 ug

## 2019-11-10 MED ORDER — PROPOFOL 1000 MG/100ML IV EMUL
INTRAVENOUS | Status: AC
  Filled 2019-11-10: qty 100

## 2019-11-10 MED ORDER — METHOCARBAMOL 500 MG IVPB - SIMPLE MED: 500.0000 mg | Freq: Four times a day (QID) | INTRAVENOUS | Status: DC | PRN

## 2019-11-10 MED ORDER — DEXAMETHASONE SODIUM PHOSPHATE 10 MG/ML IJ SOLN
INTRAMUSCULAR | Status: AC
  Filled 2019-11-10: qty 1

## 2019-11-10 MED ORDER — PHENYLEPHRINE HCL (PRESSORS) 10 MG/ML IV SOLN
INTRAVENOUS | Status: AC
  Filled 2019-11-10: qty 1

## 2019-11-10 MED ORDER — TRANEXAMIC ACID-NACL 1000-0.7 MG/100ML-% IV SOLN
INTRAVENOUS | Status: AC
  Filled 2019-11-10: qty 100

## 2019-11-10 MED ORDER — LACTATED RINGERS IV SOLN: INTRAVENOUS | Status: DC

## 2019-11-10 MED ORDER — LACTATED RINGERS IV BOLUS
500.0000 mL | Freq: Once | INTRAVENOUS | Status: AC
  Administered 2019-11-10: 500 mL via INTRAVENOUS

## 2019-11-10 MED ORDER — SODIUM CHLORIDE (PF) 0.9 % IJ SOLN
INTRAMUSCULAR | Status: AC
  Filled 2019-11-10: qty 10

## 2019-11-10 MED ORDER — MORPHINE SULFATE (PF) 4 MG/ML IV SOLN: 0.5000 mg | INTRAVENOUS | Status: DC | PRN

## 2019-11-10 MED ORDER — STERILE WATER FOR IRRIGATION IR SOLN
Status: DC | PRN
  Administered 2019-11-10: 2000 mL

## 2019-11-10 MED ORDER — MEPERIDINE HCL 50 MG/ML IJ SOLN: 6.2500 mg | INTRAMUSCULAR | Status: DC | PRN

## 2019-11-10 MED ORDER — PHENYLEPHRINE HCL-NACL 10-0.9 MG/250ML-% IV SOLN
INTRAVENOUS | Status: DC | PRN
  Administered 2019-11-10: 25 ug/min via INTRAVENOUS

## 2019-11-10 MED ORDER — HYDROMORPHONE HCL 2 MG PO TABS: 2.0000 mg | ORAL_TABLET | Freq: Four times a day (QID) | ORAL | 0 refills | Status: AC | PRN

## 2019-11-10 MED ORDER — CEFAZOLIN SODIUM-DEXTROSE 2-4 GM/100ML-% IV SOLN
INTRAVENOUS | Status: AC
  Filled 2019-11-10: qty 100

## 2019-11-10 MED ORDER — METHOCARBAMOL 500 MG PO TABS: 500.0000 mg | ORAL_TABLET | Freq: Four times a day (QID) | ORAL | Status: DC | PRN

## 2019-11-10 MED ORDER — FENTANYL CITRATE (PF) 100 MCG/2ML IJ SOLN
INTRAMUSCULAR | Status: AC
  Administered 2019-11-10: 50 ug
  Filled 2019-11-10: qty 2

## 2019-11-10 MED ORDER — PHENYLEPHRINE 40 MCG/ML (10ML) SYRINGE FOR IV PUSH (FOR BLOOD PRESSURE SUPPORT)
PREFILLED_SYRINGE | INTRAVENOUS | Status: DC | PRN
  Administered 2019-11-10 (×2): 120 ug via INTRAVENOUS

## 2019-11-10 MED ORDER — ACETAMINOPHEN 10 MG/ML IV SOLN
INTRAVENOUS | Status: AC
  Filled 2019-11-10: qty 100

## 2019-11-10 MED ORDER — DEXAMETHASONE SODIUM PHOSPHATE 10 MG/ML IJ SOLN: 8.0000 mg | Freq: Once | INTRAMUSCULAR | Status: DC

## 2019-11-10 MED ORDER — ONDANSETRON HCL 4 MG/2ML IJ SOLN
INTRAMUSCULAR | Status: DC | PRN
  Administered 2019-11-10: 4 mg via INTRAVENOUS

## 2019-11-10 MED ORDER — SODIUM CHLORIDE (PF) 0.9 % IJ SOLN
INTRAMUSCULAR | Status: AC
  Filled 2019-11-10: qty 50

## 2019-11-10 MED ORDER — ASPIRIN EC 325 MG PO TBEC: 325.0000 mg | DELAYED_RELEASE_TABLET | Freq: Two times a day (BID) | ORAL | 0 refills | Status: AC

## 2019-11-10 MED ORDER — LIP MEDEX EX OINT
TOPICAL_OINTMENT | CUTANEOUS | Status: AC
  Filled 2019-11-10: qty 7

## 2019-11-10 MED ORDER — SODIUM CHLORIDE (PF) 0.9 % IJ SOLN
INTRAMUSCULAR | Status: DC | PRN
  Administered 2019-11-10: 60 mL

## 2019-11-10 MED ORDER — LACTATED RINGERS IV BOLUS
250.0000 mL | Freq: Once | INTRAVENOUS | Status: AC
  Administered 2019-11-10: 250 mL via INTRAVENOUS

## 2019-11-10 MED ORDER — PROPOFOL 10 MG/ML IV BOLUS
INTRAVENOUS | Status: DC | PRN
  Administered 2019-11-10: 10 mg via INTRAVENOUS
  Administered 2019-11-10: 20 mg via INTRAVENOUS

## 2019-11-10 SURGICAL SUPPLY — 53 items
ATTUNE MED DOME PAT 38 KNEE (Knees) ×2 IMPLANT
ATTUNE PS FEM RT SZ 5 CEM KNEE (Femur) ×2 IMPLANT
ATTUNE PSRP INSR SZ 5 10M KNEE (Insert) ×2 IMPLANT
BAG SPEC THK2 15X12 ZIP CLS (MISCELLANEOUS) ×1
BAG ZIPLOCK 12X15 (MISCELLANEOUS) ×2 IMPLANT
BASE TIBIAL ROT PLAT SZ 5 KNEE (Knees) ×1 IMPLANT
BLADE SAG 18X100X1.27 (BLADE) ×2 IMPLANT
BLADE SAW SGTL 11.0X1.19X90.0M (BLADE) ×2 IMPLANT
BLADE SURG SZ10 CARB STEEL (BLADE) ×4 IMPLANT
BNDG ELASTIC 6X5.8 VLCR STR LF (GAUZE/BANDAGES/DRESSINGS) ×2 IMPLANT
BOWL SMART MIX CTS (DISPOSABLE) ×2 IMPLANT
BSPLAT TIB 5 CMNT ROT PLAT STR (Knees) ×1 IMPLANT
CEMENT HV SMART SET (Cement) ×4 IMPLANT
COVER SURGICAL LIGHT HANDLE (MISCELLANEOUS) ×2 IMPLANT
COVER WAND RF STERILE (DRAPES) IMPLANT
CUFF TOURN SGL QUICK 34 (TOURNIQUET CUFF) ×2
CUFF TRNQT CYL 34X4.125X (TOURNIQUET CUFF) ×1 IMPLANT
DECANTER SPIKE VIAL GLASS SM (MISCELLANEOUS) ×2 IMPLANT
DRAPE U-SHAPE 47X51 STRL (DRAPES) ×2 IMPLANT
DRSG AQUACEL AG ADV 3.5X10 (GAUZE/BANDAGES/DRESSINGS) ×2 IMPLANT
DURAPREP 26ML APPLICATOR (WOUND CARE) ×2 IMPLANT
ELECT REM PT RETURN 15FT ADLT (MISCELLANEOUS) ×2 IMPLANT
GLOVE BIO SURGEON STRL SZ7 (GLOVE) ×2 IMPLANT
GLOVE BIO SURGEON STRL SZ8 (GLOVE) ×2 IMPLANT
GLOVE BIOGEL PI IND STRL 7.0 (GLOVE) ×1 IMPLANT
GLOVE BIOGEL PI IND STRL 8 (GLOVE) ×1 IMPLANT
GLOVE BIOGEL PI INDICATOR 7.0 (GLOVE) ×1
GLOVE BIOGEL PI INDICATOR 8 (GLOVE) ×1
GOWN STRL REUS W/TWL LRG LVL3 (GOWN DISPOSABLE) ×4 IMPLANT
HANDPIECE INTERPULSE COAX TIP (DISPOSABLE) ×2
HOLDER FOLEY CATH W/STRAP (MISCELLANEOUS) IMPLANT
IMMOBILIZER KNEE 20 (SOFTGOODS) ×2
IMMOBILIZER KNEE 20 THIGH 36 (SOFTGOODS) ×1 IMPLANT
KIT TURNOVER KIT A (KITS) IMPLANT
MANIFOLD NEPTUNE II (INSTRUMENTS) ×2 IMPLANT
NS IRRIG 1000ML POUR BTL (IV SOLUTION) ×2 IMPLANT
PACK TOTAL KNEE CUSTOM (KITS) ×2 IMPLANT
PADDING CAST COTTON 6X4 STRL (CAST SUPPLIES) ×2 IMPLANT
PENCIL SMOKE EVACUATOR (MISCELLANEOUS) ×2 IMPLANT
PIN DRILL FIX HALF THREAD (BIT) ×2 IMPLANT
PIN STEINMAN FIXATION KNEE (PIN) ×2 IMPLANT
PROTECTOR NERVE ULNAR (MISCELLANEOUS) ×2 IMPLANT
SET HNDPC FAN SPRY TIP SCT (DISPOSABLE) ×1 IMPLANT
STRIP CLOSURE SKIN 1/2X4 (GAUZE/BANDAGES/DRESSINGS) ×4 IMPLANT
SUT MNCRL AB 4-0 PS2 18 (SUTURE) ×2 IMPLANT
SUT STRATAFIX 0 PDS 27 VIOLET (SUTURE) ×2
SUT VIC AB 2-0 CT1 27 (SUTURE) ×6
SUT VIC AB 2-0 CT1 TAPERPNT 27 (SUTURE) ×3 IMPLANT
SUTURE STRATFX 0 PDS 27 VIOLET (SUTURE) ×1 IMPLANT
TIBIAL BASE ROT PLAT SZ 5 KNEE (Knees) ×2 IMPLANT
TRAY FOLEY MTR SLVR 16FR STAT (SET/KITS/TRAYS/PACK) ×2 IMPLANT
WATER STERILE IRR 1000ML POUR (IV SOLUTION) ×4 IMPLANT
WRAP KNEE MAXI GEL POST OP (GAUZE/BANDAGES/DRESSINGS) ×2 IMPLANT

## 2019-11-10 NOTE — Anesthesia Procedure Notes (Addendum)
Anesthesia Regional Block: Adductor canal block   Pre-Anesthetic Checklist: ,, timeout performed, Correct Patient, Correct Site, Correct Laterality, Correct Procedure, Correct Position, site marked, Risks and benefits discussed,  Surgical consent,  Pre-op evaluation,  At surgeon's request and post-op pain management  Laterality: Right  Prep: chloraprep       Needles:  Injection technique: Single-shot  Needle Type: Stimiplex     Needle Length: 9cm  Needle Gauge: 21     Additional Needles:   Procedures:,,,, ultrasound used (permanent image in chart),,,,  Narrative:  Start time: 11/10/2019 7:34 AM End time: 11/10/2019 7:39 AM Injection made incrementally with aspirations every 5 mL.  Performed by: Personally  Anesthesiologist: Lewie Loron, MD  Additional Notes: BP cuff, EKG monitors applied. Sedation begun. Artery and nerve location verified with U/S and anesthetic injected incrementally, slowly, and after negative aspirations under direct u/s guidance. Good fascial /perineural spread. Tolerated well.

## 2019-11-10 NOTE — Discharge Instructions (Addendum)
 Frank Aluisio, MD Total Joint Specialist [facility] [place] [address] , [place] [address] [phone]  TOTAL KNEE REPLACEMENT POSTOPERATIVE DIRECTIONS    Knee Rehabilitation, Guidelines Following Surgery  Results after knee surgery are often greatly improved when you follow the exercise, range of motion and muscle strengthening exercises prescribed by your doctor. Safety measures are also important to protect the knee from further injury. If any of these exercises cause you to have increased pain or swelling in your knee joint, decrease the amount until you are comfortable again and slowly increase them. If you have problems or questions, call your caregiver or physical therapist for advice.   BLOOD CLOT PREVENTION . Take a 325 mg Aspirin two times a day for three weeks following surgery. Then take an 81 mg Aspirin once a day for three weeks. Then discontinue Aspirin. . You may resume your vitamins/supplements upon discharge from the hospital. . Do not take any NSAIDs (Advil, Aleve, Ibuprofen, Meloxicam, etc.) until you have discontinued the 325 mg Aspirin.  HOME CARE INSTRUCTIONS  . Remove items at home which could result in a fall. This includes throw rugs or furniture in walking pathways.  . ICE to the affected knee as much as tolerated. Icing helps control swelling. If the swelling is well controlled you will be more comfortable and rehab easier. Continue to use ice on the knee for pain and swelling from surgery. You may notice swelling that will progress down to the foot and ankle. This is normal after surgery. Elevate the leg when you are not up walking on it.    . Continue to use the breathing machine which will help keep your temperature down. It is common for your temperature to cycle up and down following surgery, especially at night when you are not up moving around and exerting yourself. The breathing machine keeps your lungs expanded and your  temperature down. . Do not place pillow under the operative knee, focus on keeping the knee straight while resting  DIET You may resume your previous home diet once you are discharged from the hospital.  DRESSING / WOUND CARE / SHOWERING . Keep your bulky bandage on for 2 days. On the third post-operative day you may remove the Ace bandage and gauze. There is a waterproof adhesive bandage on your skin which will stay in place until your first follow-up appointment. Once you remove this you will not need to place another bandage . You may begin showering 3 days following surgery, but do not submerge the incision under water.  ACTIVITY For the first 5 days, the key is rest and control of pain and swelling . Do your home exercises twice a day starting on post-operative day 3. On the days you go to physical therapy, just do the home exercises once that day. . You should rest, ice and elevate the leg for 50 minutes out of every hour. Get up and walk/stretch for 10 minutes per hour. After 5 days you can increase your activity slowly as tolerated. . Walk with your walker as instructed. Use the walker until you are comfortable transitioning to a cane. Walk with the cane in the opposite hand of the operative leg. You may discontinue the cane once you are comfortable and walking steadily. . Avoid periods of inactivity such as sitting longer than an hour when not asleep. This helps prevent blood clots.  . You may discontinue the knee immobilizer once you are able to perform a straight   leg raise while lying down. . You may resume a sexual relationship in one month or when given the OK by your doctor.  . You may return to work once you are cleared by your doctor.  . Do not drive a car for 6 weeks or until released by your surgeon.  . Do not drive while taking narcotics.  TED HOSE STOCKINGS Wear the elastic stockings on both legs for three weeks following surgery during the day. You may remove them at night  for sleeping.  WEIGHT BEARING Weight bearing as tolerated with assist device (walker, cane, etc) as directed, use it as long as suggested by your surgeon or therapist, typically at least 4-6 weeks.  POSTOPERATIVE CONSTIPATION PROTOCOL Constipation - defined medically as fewer than three stools per week and severe constipation as less than one stool per week.  One of the most common issues patients have following surgery is constipation.  Even if you have a regular bowel pattern at home, your normal regimen is likely to be disrupted due to multiple reasons following surgery.  Combination of anesthesia, postoperative narcotics, change in appetite and fluid intake all can affect your bowels.  In order to avoid complications following surgery, here are some recommendations in order to help you during your recovery period.  . Colace (docusate) - Pick up an over-the-counter form of Colace or another stool softener and take twice a day as long as you are requiring postoperative pain medications.  Take with a full glass of water daily.  If you experience loose stools or diarrhea, hold the colace until you stool forms back up. If your symptoms do not get better within 1 week or if they get worse, check with your doctor. . Dulcolax (bisacodyl) - Pick up over-the-counter and take as directed by the product packaging as needed to assist with the movement of your bowels.  Take with a full glass of water.  Use this product as needed if not relieved by Colace only.  . MiraLax (polyethylene glycol) - Pick up over-the-counter to have on hand. MiraLax is a solution that will increase the amount of water in your bowels to assist with bowel movements.  Take as directed and can mix with a glass of water, juice, soda, coffee, or tea. Take if you go more than two days without a movement. Do not use MiraLax more than once per day. Call your doctor if you are still constipated or irregular after using this medication for 7 days  in a row.  If you continue to have problems with postoperative constipation, please contact the office for further assistance and recommendations.  If you experience "the worst abdominal pain ever" or develop nausea or vomiting, please contact the office immediatly for further recommendations for treatment.  ITCHING If you experience itching with your medications, try taking only a single pain pill, or even half a pain pill at a time.  You can also use Benadryl over the counter for itching or also to help with sleep.   MEDICATIONS See your medication summary on the "After Visit Summary" that the nursing staff will review with you prior to discharge.  You may have some home medications which will be placed on hold until you complete the course of blood thinner medication.  It is important for you to complete the blood thinner medication as prescribed by your surgeon.  Continue your approved medications as instructed at time of discharge.  PRECAUTIONS . If you experience chest pain or shortness of   breath - call 911 immediately for transfer to the hospital emergency department.  . If you develop a fever greater that 101 F, purulent drainage from wound, increased redness or drainage from wound, foul odor from the wound/dressing, or calf pain - CONTACT YOUR SURGEON.                                                   FOLLOW-UP APPOINTMENTS Make sure you keep all of your appointments after your operation with your surgeon and caregivers. You should call the office at the above phone number and make an appointment for approximately two weeks after the date of your surgery or on the date instructed by your surgeon outlined in the "After Visit Summary".  RANGE OF MOTION AND STRENGTHENING EXERCISES  Rehabilitation of the knee is important following a knee injury or an operation. After just a few days of immobilization, the muscles of the thigh which control the knee become weakened and shrink (atrophy). Knee  exercises are designed to build up the tone and strength of the thigh muscles and to improve knee motion. Often times heat used for twenty to thirty minutes before working out will loosen up your tissues and help with improving the range of motion but do not use heat for the first two weeks following surgery. These exercises can be done on a training (exercise) mat, on the floor, on a table or on a bed. Use what ever works the best and is most comfortable for you Knee exercises include:  . Leg Lifts - While your knee is still immobilized in a splint or cast, you can do straight leg raises. Lift the leg to 60 degrees, hold for 3 sec, and slowly lower the leg. Repeat 10-20 times 2-3 times daily. Perform this exercise against resistance later as your knee gets better.  . Quad and Hamstring Sets - Tighten up the muscle on the front of the thigh (Quad) and hold for 5-10 sec. Repeat this 10-20 times hourly. Hamstring sets are done by pushing the foot backward against an object and holding for 5-10 sec. Repeat as with quad sets.   Leg Slides: Lying on your back, slowly slide your foot toward your buttocks, bending your knee up off the floor (only go as far as is comfortable). Then slowly slide your foot back down until your leg is flat on the floor again.  Angel Wings: Lying on your back spread your legs to the side as far apart as you can without causing discomfort.  A rehabilitation program following serious knee injuries can speed recovery and prevent re-injury in the future due to weakened muscles. Contact your doctor or a physical therapist for more information on knee rehabilitation.   IF YOU ARE TRANSFERRED TO A SKILLED REHAB FACILITY If the patient is transferred to a skilled rehab facility following release from the hospital, a list of the current medications will be sent to the facility for the patient to continue.  When discharged from the skilled rehab facility, please have the facility set up the  patient's Home Health Physical Therapy prior to being released. Also, the skilled facility will be responsible for providing the patient with their medications at time of release from the facility to include their pain medication, the muscle relaxants, and their blood thinner medication. If the patient is still at the   rehab facility at time of the two week follow up appointment, the skilled rehab facility will also need to assist the patient in arranging follow up appointment in our office and any transportation needs.  MAKE SURE YOU:  . Understand these instructions.  . Get help right away if you are not doing well or get worse.   DENTAL ANTIBIOTICS:  In most cases prophylactic antibiotics for Dental procdeures after total joint surgery are not necessary.  Exceptions are as follows:  1. History of prior total joint infection  2. Severely immunocompromised (Organ Transplant, cancer chemotherapy, Rheumatoid biologic meds such as Humera)  3. Poorly controlled diabetes (A1C &gt; 8.0, blood glucose over 200)  If you have one of these conditions, contact your surgeon for an antibiotic prescription, prior to your dental procedure.    Pick up stool softner and laxative for home use following surgery while on pain medications. Do not submerge incision under water. Please use good hand washing techniques while changing dressing each day. May shower starting three days after surgery. Please use a clean towel to pat the incision dry following showers. Continue to use ice for pain and swelling after surgery. Do not use any lotions or creams on the incision until instructed by your surgeon.  

## 2019-11-10 NOTE — Progress Notes (Signed)
Assisted Dr. Germeroth with right, ultrasound guided, adductor canal block. Side rails up, monitors on throughout procedure. See vital signs in flow sheet. Tolerated Procedure well. 

## 2019-11-10 NOTE — Anesthesia Procedure Notes (Signed)
Spinal  Patient location during procedure: OR Staffing Performed: anesthesiologist and resident/CRNA  Anesthesiologist: Nolon Nations, MD Resident/CRNA: Niel Hummer, CRNA Preanesthetic Checklist Completed: patient identified, IV checked, site marked, risks and benefits discussed, surgical consent, monitors and equipment checked, pre-op evaluation and timeout performed Spinal Block Patient position: sitting Prep: DuraPrep and site prepped and draped Patient monitoring: heart rate, continuous pulse ox and blood pressure Approach: right paramedian Location: L2-3 Injection technique: single-shot Needle Needle type: Sprotte  Needle gauge: 24 G Needle length: 9 cm Additional Notes Expiration date of kit checked and confirmed. Patient tolerated procedure well, without complications. 1st attempt by CRNA, no CSF return. 2nd by [facility].

## 2019-11-10 NOTE — Progress Notes (Signed)
Orthopedic Tech Progress Note Patient Details:  Cathy Greene Jul 25, 1945 [id number]  CPM Right Knee CPM Right Knee: On Right Knee Flexion (Degrees): 40 Right Knee Extension (Degrees): 10  Post Interventions Patient Tolerated: Well Instructions Provided: Care of device  Saul Fordyce 11/10/2019, 11:24 AM

## 2019-11-10 NOTE — Anesthesia Postprocedure Evaluation (Signed)
Anesthesia Post Note  Patient: Cathy Greene  Procedure(s) Performed: TOTAL KNEE ARTHROPLASTY (Right Knee)     Patient location during evaluation: PACU Anesthesia Type: Spinal Level of consciousness: awake and alert Pain management: pain level controlled Vital Signs Assessment: post-procedure vital signs reviewed and stable Respiratory status: spontaneous breathing and respiratory function stable Cardiovascular status: blood pressure returned to baseline and stable Postop Assessment: spinal receding Anesthetic complications: no   No complications documented.  Last Vitals:  Vitals:   11/10/19 1500 11/10/19 1609  BP: (!) 123/51 (!) 143/70  Pulse: 79 82  Resp: 19 18  Temp: 36.4 C 36.5 C  SpO2: 100% 100%    Last Pain:  Vitals:   11/10/19 1609  TempSrc: Oral  PainSc: 0-No pain                 Lewie Loron

## 2019-11-10 NOTE — Evaluation (Signed)
Physical Therapy Evaluation Patient Details Name: Cathy Greene MRN: [id number] DOB: March 11, 1946 Today's Date: 11/10/2019   History of Present Illness  Patient is 74 y.o. female s/p Rt TKA on 11/10/19 with PMH significant for osteopenia, OA, angina, HTN, Lt TKA in 2016.    Clinical Impression  Cathy Greene is a 74 y.o. female POD 0 s/p Rt TKA. Patient reports independence with mobility at baseline. Patient is now limited by functional impairments (see PT problem list below) and requires min guard/supervision for transfers and gait with RW. Patient was able to ambulate ~120 feet with RW and min guard/supervision and cues for safe walker management. Patient educated on safe sequencing for stair mobility and verbalized safe guarding position for people assisting with mobility. Patient instructed in exercises to facilitate ROM and circulation. Patient will benefit from continued skilled PT interventions to address impairments and progress towards PLOF. Patient has met mobility goals at adequate level for discharge home; will continue to follow if pt continues acute stay to progress towards Mod I goals.     Follow Up Recommendations Follow surgeon's recommendation for DC plan and follow-up therapies;Outpatient PT    Equipment Recommendations  None recommended by PT    Recommendations for Other Services       Precautions / Restrictions Precautions Precautions: Fall Restrictions Weight Bearing Restrictions: No Other Position/Activity Restrictions: WBAT      Mobility  Bed Mobility Overal bed mobility: Needs Assistance Bed Mobility: Supine to Sit;Sit to Supine     Supine to sit: Supervision Sit to supine: Supervision   General bed mobility comments: no assist required, pt took extra time.   Transfers Overall transfer level: Needs assistance Equipment used: Rolling walker (2 wheeled) Transfers: Sit to/from Stand Sit to Stand: Supervision;Min assist;Min guard         General  transfer comment: cues for technique with RW, pt able to rise, unsteady on first attempt requiring min assist. Min guard/supervision for second rise.  Ambulation/Gait Ambulation/Gait assistance: Min guard;Supervision Gait Distance (Feet): 130 Feet Assistive device: Rolling walker (2 wheeled) Gait Pattern/deviations: Step-to pattern;Decreased stride length;Decreased weight shift to right;Decreased stance time - right Gait velocity: decr   General Gait Details: Cues for safe step pattern and proximity to RW. pt maintained safe proximity. No overt LOB noted.   Stairs Stairs: Yes Stairs assistance: Min guard;Supervision Stair Management: No rails;Backwards;With walker;Step to pattern Number of Stairs: 4 (2x2) General stair comments: Cues for step sequencing on stairs. Min guard for safety and walker management. pt performed x2 and verbalized safe guarding for family.   Wheelchair Mobility    Modified Rankin (Stroke Patients Only)       Balance Overall balance assessment: Needs assistance Sitting-balance support: Feet supported Sitting balance-Leahy Scale: Good     Standing balance support: During functional activity;Bilateral upper extremity supported Standing balance-Leahy Scale: Fair              Pertinent Vitals/Pain Pain Assessment: No/denies pain    Home Living Family/patient expects to be discharged to:: Private residence Living Arrangements: Spouse/significant other;Children Available Help at Discharge: Family Type of Home: House Home Access: Stairs to enter Entrance Stairs-Rails: None Entrance Stairs-Number of Steps: 2+1 Home Layout: One level Home Equipment: Environmental consultant - 2 wheels;Cane - single point;Bedside commode      Prior Function Level of Independence: Independent            Hand Dominance   Dominant Hand: Right    Extremity/Trunk Assessment   Upper Extremity Assessment Upper  Extremity Assessment: Overall WFL for tasks assessed    Lower  Extremity Assessment Lower Extremity Assessment: Overall WFL for tasks assessed;RLE deficits/detail RLE Deficits / Details: good quad activation, no extensor lag with SLR RLE Sensation: WNL RLE Coordination: WNL    Cervical / Trunk Assessment Cervical / Trunk Assessment: Normal  Communication   Communication: No difficulties  Cognition Arousal/Alertness: Awake/alert Behavior During Therapy: WFL for tasks assessed/performed Overall Cognitive Status: Within Functional Limits for tasks assessed           General Comments      Exercises Total Joint Exercises Ankle Circles/Pumps: AROM;Both;10 reps;Supine Quad Sets: AROM;Right;Other reps (comment);Supine (2) Short Arc Quad: AROM;Right;Supine;Other reps (comment) (2) Heel Slides: AROM;Right;Other reps (comment);Supine (2) Hip ABduction/ADduction: AROM;Right;Other reps (comment);Supine (2) Straight Leg Raises: AROM;Right;Other reps (comment);Supine (2) Long Arc Quad: AROM;Right;Other reps (comment);Seated (2) Knee Flexion: AROM;AAROM;Right;Other reps (comment);Seated (2)   Assessment/Plan    PT Assessment Patient needs continued PT services  PT Problem List Decreased strength;Decreased range of motion;Decreased activity tolerance;Decreased balance;Decreased mobility;Decreased knowledge of use of DME       PT Treatment Interventions DME instruction;Stair training;Gait training;Functional mobility training;Therapeutic activities;Therapeutic exercise;Balance training;Patient/family education    PT Goals (Current goals can be found in the Care Plan section)  Acute Rehab PT Goals Patient Stated Goal: get home and recover to walk without pain PT Goal Formulation: With patient Time For Goal Achievement: 11/17/19 Potential to Achieve Goals: Good    Frequency 7X/week   Barriers to discharge           AM-PAC PT "6 Clicks" Mobility  Outcome Measure Help needed turning from your back to your side while in a flat bed without using  bedrails?: None Help needed moving from lying on your back to sitting on the side of a flat bed without using bedrails?: None Help needed moving to and from a bed to a chair (including a wheelchair)?: A Little Help needed standing up from a chair using your arms (e.g., wheelchair or bedside chair)?: A Little Help needed to walk in hospital room?: A Little Help needed climbing 3-5 steps with a railing? : A Little 6 Click Score: 20    End of Session Equipment Utilized During Treatment: Gait belt Activity Tolerance: Patient tolerated treatment well Patient left: in bed;with call bell/phone within reach Nurse Communication: Mobility status PT Visit Diagnosis: Muscle weakness (generalized) (M62.81);Difficulty in walking, not elsewhere classified (R26.2)    Time: 4037-5436 PT Time Calculation (min) (ACUTE ONLY): 38 min   Charges:   PT Evaluation $PT Eval Low Complexity: 1 Low PT Treatments $Gait Training: 8-22 mins $Therapeutic Exercise: 8-22 mins       Verner Mould, DPT Acute Rehabilitation Services  Office [phone] Pager [phone]  11/10/2019 2:26 PM

## 2019-11-10 NOTE — Interval H&P Note (Signed)
History and Physical Interval Note:  11/10/2019 6:43 AM  Cathy Greene  has presented today for surgery, with the diagnosis of right knee osteoarthritis.  The various methods of treatment have been discussed with the patient and family. After consideration of risks, benefits and other options for treatment, the patient has consented to  Procedure(s) with comments: TOTAL KNEE ARTHROPLASTY (Right) - as a surgical intervention.  The patient's history has been reviewed, patient examined, no change in status, stable for surgery.  I have reviewed the patient's chart and labs.  Questions were answered to the patient's satisfaction.     Homero Fellers Braeleigh Pyper

## 2019-11-10 NOTE — Anesthesia Procedure Notes (Signed)
Procedure Name: MAC Date/Time: 11/10/2019 8:26 AM Performed by: Niel Hummer, CRNA Pre-anesthesia Checklist: Patient identified, Emergency Drugs available, Suction available and Patient being monitored Oxygen Delivery Method: Simple face mask

## 2019-11-10 NOTE — Transfer of Care (Signed)
Immediate Anesthesia Transfer of Care Note  Patient: Cathy Greene  Procedure(s) Performed: TOTAL KNEE ARTHROPLASTY (Right Knee)  Patient Location: PACU  Anesthesia Type:Spinal  Level of Consciousness: awake, alert  and oriented  Airway & Oxygen Therapy: Patient Spontanous Breathing and Patient connected to face mask oxygen  Post-op Assessment: Report given to RN and Post -op Vital signs reviewed and stable  Post vital signs: Reviewed and stable  Last Vitals:  Vitals Value Taken Time  BP    Temp    Pulse 72 11/10/19 0958  Resp 12 11/10/19 0958  SpO2 100 % 11/10/19 0958  Vitals shown include unvalidated device data.  Last Pain:  Vitals:   11/10/19 0622  TempSrc: Oral      Patients Stated Pain Goal: 4 (11/10/19 0641)  Complications: No complications documented.

## 2019-11-10 NOTE — Op Note (Addendum)
OPERATIVE REPORT-TOTAL KNEE ARTHROPLASTY   Pre-operative diagnosis- Osteoarthritis  Right knee(s)  Post-operative diagnosis- Osteoarthritis Right knee(s)  Procedure-  Right  Total Knee Arthroplasty  Surgeon- Gus Rankin. Amer Alcindor, MD  Assistant- Leilani Able, PA-C   Anesthesia-  Adductor canal block and spinal  EBL-50 mL   Drains None  Tourniquet time-  Total Tourniquet Time Documented: Thigh (Right) - 37 minutes Total: Thigh (Right) - 37 minutes     Complications- None  Condition-PACU - hemodynamically stable.   Brief Clinical Note  Cathy Greene is a 74 y.o. year old female with end stage OA of her right knee with progressively worsening pain and dysfunction. She has constant pain, with activity and at rest and significant functional deficits with difficulties even with ADLs. She has had extensive non-op management including analgesics, injections of cortisone and viscosupplements, and home exercise program, but remains in significant pain with significant dysfunction. Radiographs show bone on bone arthritis medial and patellofemoral. She presents now for left Total Knee Arthroplasty.    Procedure in detail---   The patient is brought into the operating room and positioned supine on the operating table. After successful administration of  Adductor canal block and spinal,   a tourniquet is placed high on the  Right thigh(s) and the lower extremity is prepped and draped in the usual sterile fashion. Time out is performed by the operating team and then the  Right lower extremity is wrapped in Esmarch, knee flexed and the tourniquet inflated to 300 mmHg.       A midline incision is made with a ten blade through the subcutaneous tissue to the level of the extensor mechanism. A fresh blade is used to make a medial parapatellar arthrotomy. Soft tissue over the proximal medial tibia is subperiosteally elevated to the joint line with a knife and into the semimembranosus bursa with a Cobb  elevator. Soft tissue over the proximal lateral tibia is elevated with attention being paid to avoiding the patellar tendon on the tibial tubercle. The patella is everted, knee flexed 90 degrees and the ACL and PCL are removed. Findings are bone on bone medial and patellfemoral with large global osteophytes.       The drill is used to create a starting hole in the distal femur and the canal is thoroughly irrigated with sterile saline to remove the fatty contents. The 5 degree Right  valgus alignment guide is placed into the femoral canal and the distal femoral cutting block is pinned to remove 9 mm off the distal femur. Resection is made with an oscillating saw.      The tibia is subluxed forward and the menisci are removed. The extramedullary alignment guide is placed referencing proximally at the medial aspect of the tibial tubercle and distally along the second metatarsal axis and tibial crest. The block is pinned to remove 61mm off the more deficient medial  side. Resection is made with an oscillating saw. Size 5is the most appropriate size for the tibia and the proximal tibia is prepared with the modular drill and keel punch for that size.      The femoral sizing guide is placed and size 5 is most appropriate. Rotation is marked off the epicondylar axis and confirmed by creating a rectangular flexion gap at 90 degrees. The size 5 cutting block is pinned in this rotation and the anterior, posterior and chamfer cuts are made with the oscillating saw. The intercondylar block is then placed and that cut is made.  Trial size 5 tibial component, trial size 5 posterior stabilized femur and a 10  mm posterior stabilized rotating platform insert trial is placed. Full extension is achieved with excellent varus/valgus and anterior/posterior balance throughout full range of motion. The patella is everted and thickness measured to be 22  mm. Free hand resection is taken to 12 mm, a 38 template is placed, lug holes  are drilled, trial patella is placed, and it tracks normally. Osteophytes are removed off the posterior femur with the trial in place. All trials are removed and the cut bone surfaces prepared with pulsatile lavage. Cement is mixed and once ready for implantation, the size 5 tibial implant, size  5 posterior stabilized femoral component, and the size 38 patella are cemented in place and the patella is held with the clamp. The trial insert is placed and the knee held in full extension. The Exparel (20 ml mixed with 60 ml saline) is injected into the extensor mechanism, posterior capsule, medial and lateral gutters and subcutaneous tissues.  All extruded cement is removed and once the cement is hard the permanent 10 mm posterior stabilized rotating platform insert is placed into the tibial tray.      The wound is copiously irrigated with saline solution and the extensor mechanism closed with # 0 Stratofix suture. The tourniquet is released for a total tourniquet time of 37  minutes. Flexion against gravity is 140 degrees and the patella tracks normally. Subcutaneous tissue is closed with 2.0 vicryl and subcuticular with running 4.0 Monocryl. The incision is cleaned and dried and steri-strips and a bulky sterile dressing are applied. The limb is placed into a knee immobilizer and the patient is awakened and transported to recovery in stable condition.      Please note that a surgical assistant was a medical necessity for this procedure in order to perform it in a safe and expeditious manner. Surgical assistant was necessary to retract the ligaments and vital neurovascular structures to prevent injury to them and also necessary for proper positioning of the limb to allow for anatomic placement of the prosthesis.   Gus Rankin Cathy Belgrave, MD    11/10/2019, 9:36 AM

## 2019-11-11 ENCOUNTER — Encounter (HOSPITAL_COMMUNITY): Payer: Self-pay | Admitting: Orthopedic Surgery

## 2019-11-13 DIAGNOSIS — M25661 Stiffness of right knee, not elsewhere classified: Secondary | ICD-10-CM | POA: Diagnosis not present

## 2019-11-13 DIAGNOSIS — M25561 Pain in right knee: Secondary | ICD-10-CM | POA: Diagnosis not present

## 2019-11-18 DIAGNOSIS — M25661 Stiffness of right knee, not elsewhere classified: Secondary | ICD-10-CM | POA: Diagnosis not present

## 2019-11-18 DIAGNOSIS — M25561 Pain in right knee: Secondary | ICD-10-CM | POA: Diagnosis not present

## 2019-11-20 DIAGNOSIS — M25561 Pain in right knee: Secondary | ICD-10-CM | POA: Diagnosis not present

## 2019-11-20 DIAGNOSIS — M25661 Stiffness of right knee, not elsewhere classified: Secondary | ICD-10-CM | POA: Diagnosis not present

## 2019-11-20 NOTE — Discharge Summary (Addendum)
Physician Discharge Summary   Patient ID: Cathy Greene MRN: [id number] DOB/AGE: 09/12/1945 74 y.o.  Admit date: 11/10/2019 Discharge date: 11/10/2019  Primary Diagnosis: Osteoarthritis, right knee   Admission Diagnoses:  Past Medical History:  Diagnosis Date  . Abnormal uterine bleeding   . Anginal pain (HCC) 2012  . Arthritis FINGERS AND KNEES  . Chest pain, exertional    a. ETT-Myoview 6/12: no ischemia, EF 66%;  b.  ETT-Myoview 6/14: No scar or ischemia, EF 69%  . Heart murmur   . History of kidney stones dec 2013  . Hypertension   . Osteopenia   . PONV (postoperative nausea and vomiting) 1979   with laparoscopy  . Rosacea    Discharge Diagnoses:   Active Problems:   Osteoarthritis of knee  Estimated body mass index is 28.79 kg/m as calculated from the following:   Height as of this encounter: 5\' 5"  (1.651 m).   Weight as of this encounter: 78.5 kg.  Procedure:  Procedure(s) (LRB): TOTAL KNEE ARTHROPLASTY (Right)   Consults: None  HPI: Cathy Greene is a 74 y.o. year old female with end stage OA of her right knee with progressively worsening pain and dysfunction. She has constant pain, with activity and at rest and significant functional deficits with difficulties even with ADLs. She has had extensive non-op management including analgesics, injections of cortisone and viscosupplements, and home exercise program, but remains in significant pain with significant dysfunction. Radiographs show bone on bone arthritis medial and patellofemoral. She presents now for right Total Knee Arthroplasty.    Laboratory Data: Hospital Outpatient Visit on 11/06/2019  Component Date Value Ref Range Status  . SARS Coronavirus 2 11/06/2019 NEGATIVE  NEGATIVE Final   Comment: (NOTE) SARS-CoV-2 target nucleic acids are NOT DETECTED.  The SARS-CoV-2 RNA is generally detectable in upper and lower respiratory specimens during the acute phase of infection. Negative results do not preclude  SARS-CoV-2 infection, do not rule out co-infections with other pathogens, and should not be used as the sole basis for treatment or other patient management decisions. Negative results must be combined with clinical observations, patient history, and epidemiological information. The expected result is Negative.  Fact Sheet for Patients: 11/08/2019  Fact Sheet for Healthcare Providers: HairSlick.no  This test is not yet approved or cleared by the quierodirigir.com FDA and  has been authorized for detection and/or diagnosis of SARS-CoV-2 by FDA under an Emergency Use Authorization (EUA). This EUA will remain  in effect (meaning this test can be used) for the duration of the COVID-19 declaration under Se                          ction 564(b)(1) of the Act, 21 U.S.C. section 360bbb-3(b)(1), unless the authorization is terminated or revoked sooner.  Performed at [facility], [address] [place], [place]   Hospital Outpatient Visit on 11/04/2019  Component Date Value Ref Range Status  . aPTT 11/04/2019 27  24 - 36 seconds Final   Performed at [facility], [address], [place], [place]  . WBC 11/04/2019 5.3  4.0 - 10.5 K/uL Final  . RBC 11/04/2019 4.44  3.87 - 5.11 MIL/uL Final  . Hemoglobin 11/04/2019 13.2  12.0 - 15.0 g/dL Final  . HCT 11/06/2019 40.0  36 - 46 % Final  . MCV 11/04/2019 90.1  80.0 - 100.0 fL Final  . MCH 11/04/2019 29.7  26.0 -  34.0 pg Final  . MCHC 11/04/2019 33.0  30.0 - 36.0 g/dL Final  . RDW 18/84/1660 14.0  11.5 - 15.5 % Final  . Platelets 11/04/2019 211  150 - 400 K/uL Final  . nRBC 11/04/2019 0.0  0.0 - 0.2 % Final   Performed at [facility], [address] [place], [place] [address]  . Sodium 11/04/2019 135  135 - 145 mmol/L Final  . Potassium 11/04/2019 3.9  3.5 - 5.1 mmol/L Final  . Chloride 11/04/2019 102  98 - 111 mmol/L  Final  . CO2 11/04/2019 27  22 - 32 mmol/L Final  . Glucose, Bld 11/04/2019 95  70 - 99 mg/dL Final   Glucose reference range applies only to samples taken after fasting for at least 8 hours.  . BUN 11/04/2019 11  8 - 23 mg/dL Final  . Creatinine, Ser 11/04/2019 0.55  0.44 - 1.00 mg/dL Final  . Calcium 03/21/3233 8.8* 8.9 - 10.3 mg/dL Final  . Total Protein 11/04/2019 6.5  6.5 - 8.1 g/dL Final  . Albumin 57/32/2025 3.8  3.5 - 5.0 g/dL Final  . AST 42/70/6237 20  15 - 41 U/L Final  . ALT 11/04/2019 17  0 - 44 U/L Final  . Alkaline Phosphatase 11/04/2019 76  38 - 126 U/L Final  . Total Bilirubin 11/04/2019 0.7  0.3 - 1.2 mg/dL Final  . GFR calc non Af Amer 11/04/2019 >60  >60 mL/min Final  . GFR calc Af Amer 11/04/2019 >60  >60 mL/min Final  . Anion gap 11/04/2019 6  5 - 15 Final   Performed at [facility], [address] [place], [place] [address]  . Prothrombin Time 11/04/2019 12.8  11.4 - 15.2 seconds Final  . INR 11/04/2019 1.0  0.8 - 1.2 Final   Comment: (NOTE) INR goal varies based on device and disease states. Performed at [facility], [address], [place], [place] [address]   . ABO/RH(D) 11/04/2019 O POS   Final  . Antibody Screen 11/04/2019 NEG   Final  . Sample Expiration 11/04/2019 11/13/2019,2359   Final  . Extend sample reason 11/04/2019    Final                   Value:NO TRANSFUSIONS OR PREGNANCY IN THE PAST 3 MONTHS Performed at [facility], [address] [place], [place] [address]   . MRSA, PCR 11/04/2019 NEGATIVE  NEGATIVE Final  . Staphylococcus aureus 11/04/2019 NEGATIVE  NEGATIVE Final   Comment: (NOTE) The Xpert SA Assay (FDA approved for NASAL specimens in patients 41 years of age and older), is one component of a comprehensive surveillance program. It is not intended to diagnose infection nor to guide or monitor treatment. Performed at [facility], [address] [place], [place] [address]      X-Rays:No results found.  EKG: Orders placed or performed during the hospital encounter of 11/04/19  . EKG 12 lead per protocol  . EKG 12 lead per protocol  . EKG 12-Lead  . EKG 12-Lead     Hospital Course: Cathy Greene is a 74 y.o. who was admitted to [facility]. They were brought to the operating room on 11/10/2019 and underwent Procedure(s): TOTAL KNEE ARTHROPLASTY.  Patient tolerated the procedure well and was later transferred to the recovery room for postoperative care. They were given PO and IV analgesics for pain control following their surgery. They were given  24 hours of postoperative antibiotics of  Anti-infectives (From admission, onward)   Start     Dose/Rate Route Frequency Ordered Stop   11/10/19 0645  ceFAZolin (ANCEF) IVPB 2g/100 mL premix        2 g 200 mL/hr over 30 Minutes Intravenous On call to O.R. 11/10/19 0630 11/10/19 0903   11/10/19 0635  ceFAZolin (ANCEF) 2-4 GM/100ML-% IVPB       Note to Pharmacy: Vevelyn Royals  : cabinet override      11/10/19 7510 11/10/19 0841     and started on DVT prophylaxis in the form of Aspirin. Physical therapy was ordered for total joint protocol. Patient received three normal saline boluses in recovery and completed a session of physical therapy. Pain was well controlled with medications, patient was able to void on his own, and was meeting her goals with therapy. Pt was discharged to home on a same day discharge in stable condition.  Diet: Regular diet Activity: WBAT Follow-up: in 2 weeks Disposition: Home Discharged Condition: stable    Allergies as of 11/10/2019      Reactions   Codeine Other (See Comments)   "HYPER"   Oxycodone Nausea Only      Medication List    TAKE these medications   aspirin EC 325 MG tablet Take 1 tablet (325 mg total) by mouth 2 (two) times daily for 21 days. Then take one 81 mg aspirin once a day for three weeks. Then discontinue aspirin.    CITRACAL +D3 PO Take 2 capsules by mouth 2 (two) times daily.   gabapentin 300 MG capsule Commonly known as: Neurontin Take a 300 mg capsule three times a day for two weeks following surgery.Then take a 300 mg capsule two times a day for two weeks. Then take a 300 mg capsule once a day for two weeks. Then discontinue.   lisinopril-hydrochlorothiazide 20-25 MG tablet Commonly known as: ZESTORETIC HOLD UNTIL FURTHER NOTIFIED BY CARDIOLOGY What changed:   how much to take  how to take this  when to take this  additional instructions   methocarbamol 500 MG tablet Commonly known as: Robaxin Take 1 tablet (500 mg total) by mouth every 6 (six) hours as needed for muscle spasms.   PreserVision AREDS 2 Caps Take 1 tablet by mouth 2 (two) times daily.   raloxifene 60 MG tablet Commonly known as: EVISTA Take 60 mg by mouth daily.   traMADol 50 MG tablet Commonly known as: Ultram Take 1-2 tablets (50-100 mg total) by mouth every 6 (six) hours as needed for moderate pain.     ASK your doctor about these medications   HYDROmorphone 2 MG tablet Commonly known as: Dilaudid Take 1-2 tablets (2-4 mg total) by mouth every 6 (six) hours as needed for up to 5 days for severe pain. Not to exceed 6 tablets a day. Ask about: Should I take this medication?       Follow-up Information    Ollen Gross, MD. Schedule an appointment as soon as possible for a visit in 2 week(s).   Specialty: Orthopedic Surgery Contact information: [address] [place] [address] [place] [address] [phone]               Signed: Arther Abbott, PA-C Orthopedic Surgery 11/20/2019, 4:26 PM

## 2019-11-21 DIAGNOSIS — M25661 Stiffness of right knee, not elsewhere classified: Secondary | ICD-10-CM | POA: Diagnosis not present

## 2019-11-21 DIAGNOSIS — M25561 Pain in right knee: Secondary | ICD-10-CM | POA: Diagnosis not present

## 2019-11-25 DIAGNOSIS — M25561 Pain in right knee: Secondary | ICD-10-CM | POA: Diagnosis not present

## 2019-11-25 DIAGNOSIS — M25661 Stiffness of right knee, not elsewhere classified: Secondary | ICD-10-CM | POA: Diagnosis not present

## 2019-11-27 DIAGNOSIS — M25561 Pain in right knee: Secondary | ICD-10-CM | POA: Diagnosis not present

## 2019-11-27 DIAGNOSIS — M25661 Stiffness of right knee, not elsewhere classified: Secondary | ICD-10-CM | POA: Diagnosis not present

## 2019-11-28 DIAGNOSIS — M25661 Stiffness of right knee, not elsewhere classified: Secondary | ICD-10-CM | POA: Diagnosis not present

## 2019-11-28 DIAGNOSIS — M25561 Pain in right knee: Secondary | ICD-10-CM | POA: Diagnosis not present

## 2019-12-01 DIAGNOSIS — M25661 Stiffness of right knee, not elsewhere classified: Secondary | ICD-10-CM | POA: Diagnosis not present

## 2019-12-01 DIAGNOSIS — M25561 Pain in right knee: Secondary | ICD-10-CM | POA: Diagnosis not present

## 2019-12-03 DIAGNOSIS — M25561 Pain in right knee: Secondary | ICD-10-CM | POA: Diagnosis not present

## 2019-12-03 DIAGNOSIS — M25661 Stiffness of right knee, not elsewhere classified: Secondary | ICD-10-CM | POA: Diagnosis not present

## 2019-12-05 ENCOUNTER — Other Ambulatory Visit (HOSPITAL_COMMUNITY): Payer: Self-pay | Admitting: Orthopedic Surgery

## 2019-12-05 ENCOUNTER — Ambulatory Visit (HOSPITAL_COMMUNITY)
Admission: RE | Admit: 2019-12-05 | Discharge: 2019-12-05 | Disposition: A | Discharge status: Home / Self Care | Payer: Medicare Other | Source: Ambulatory Visit | Attending: Cardiovascular Disease | Admitting: Cardiovascular Disease

## 2019-12-05 ENCOUNTER — Other Ambulatory Visit: Payer: Self-pay

## 2019-12-05 DIAGNOSIS — M25661 Stiffness of right knee, not elsewhere classified: Secondary | ICD-10-CM | POA: Diagnosis not present

## 2019-12-05 DIAGNOSIS — M7989 Other specified soft tissue disorders: Secondary | ICD-10-CM | POA: Diagnosis not present

## 2019-12-05 DIAGNOSIS — M79661 Pain in right lower leg: Secondary | ICD-10-CM | POA: Diagnosis not present

## 2019-12-05 DIAGNOSIS — M25561 Pain in right knee: Secondary | ICD-10-CM | POA: Diagnosis not present

## 2019-12-08 DIAGNOSIS — M25661 Stiffness of right knee, not elsewhere classified: Secondary | ICD-10-CM | POA: Diagnosis not present

## 2019-12-08 DIAGNOSIS — M25561 Pain in right knee: Secondary | ICD-10-CM | POA: Diagnosis not present

## 2019-12-10 DIAGNOSIS — M25661 Stiffness of right knee, not elsewhere classified: Secondary | ICD-10-CM | POA: Diagnosis not present

## 2019-12-10 DIAGNOSIS — M25561 Pain in right knee: Secondary | ICD-10-CM | POA: Diagnosis not present

## 2019-12-15 DIAGNOSIS — M25561 Pain in right knee: Secondary | ICD-10-CM | POA: Diagnosis not present

## 2019-12-15 DIAGNOSIS — M25661 Stiffness of right knee, not elsewhere classified: Secondary | ICD-10-CM | POA: Diagnosis not present

## 2019-12-16 DIAGNOSIS — Z96651 Presence of right artificial knee joint: Secondary | ICD-10-CM | POA: Diagnosis not present

## 2019-12-16 DIAGNOSIS — Z471 Aftercare following joint replacement surgery: Secondary | ICD-10-CM | POA: Diagnosis not present

## 2019-12-17 DIAGNOSIS — M25661 Stiffness of right knee, not elsewhere classified: Secondary | ICD-10-CM | POA: Diagnosis not present

## 2019-12-18 DIAGNOSIS — I10 Essential (primary) hypertension: Secondary | ICD-10-CM | POA: Diagnosis not present

## 2019-12-18 DIAGNOSIS — M179 Osteoarthritis of knee, unspecified: Secondary | ICD-10-CM | POA: Diagnosis not present

## 2019-12-18 DIAGNOSIS — M899 Disorder of bone, unspecified: Secondary | ICD-10-CM | POA: Diagnosis not present

## 2019-12-18 DIAGNOSIS — Z0000 Encounter for general adult medical examination without abnormal findings: Secondary | ICD-10-CM | POA: Diagnosis not present

## 2020-01-06 DIAGNOSIS — Z0120 Encounter for dental examination and cleaning without abnormal findings: Secondary | ICD-10-CM | POA: Diagnosis not present

## 2020-01-15 DIAGNOSIS — H5213 Myopia, bilateral: Secondary | ICD-10-CM | POA: Diagnosis not present

## 2020-01-20 DIAGNOSIS — H353222 Exudative age-related macular degeneration, left eye, with inactive choroidal neovascularization: Secondary | ICD-10-CM | POA: Diagnosis not present

## 2020-01-20 DIAGNOSIS — H353114 Nonexudative age-related macular degeneration, right eye, advanced atrophic with subfoveal involvement: Secondary | ICD-10-CM | POA: Diagnosis not present

## 2020-03-11 DIAGNOSIS — Z20822 Contact with and (suspected) exposure to covid-19: Secondary | ICD-10-CM | POA: Diagnosis not present

## 2020-04-12 DIAGNOSIS — Z01812 Encounter for preprocedural laboratory examination: Secondary | ICD-10-CM | POA: Diagnosis not present

## 2020-04-14 DIAGNOSIS — Z1211 Encounter for screening for malignant neoplasm of colon: Secondary | ICD-10-CM | POA: Diagnosis not present

## 2020-04-20 DIAGNOSIS — H353114 Nonexudative age-related macular degeneration, right eye, advanced atrophic with subfoveal involvement: Secondary | ICD-10-CM | POA: Diagnosis not present

## 2020-04-20 DIAGNOSIS — H353222 Exudative age-related macular degeneration, left eye, with inactive choroidal neovascularization: Secondary | ICD-10-CM | POA: Diagnosis not present

## 2020-04-20 DIAGNOSIS — H43813 Vitreous degeneration, bilateral: Secondary | ICD-10-CM | POA: Diagnosis not present

## 2020-04-20 DIAGNOSIS — H26493 Other secondary cataract, bilateral: Secondary | ICD-10-CM | POA: Diagnosis not present

## 2020-05-21 ENCOUNTER — Other Ambulatory Visit: Payer: Self-pay | Admitting: Family Medicine

## 2020-05-21 DIAGNOSIS — Z1231 Encounter for screening mammogram for malignant neoplasm of breast: Secondary | ICD-10-CM

## 2020-07-14 ENCOUNTER — Ambulatory Visit: Payer: Medicare Other

## 2020-07-27 DIAGNOSIS — H353114 Nonexudative age-related macular degeneration, right eye, advanced atrophic with subfoveal involvement: Secondary | ICD-10-CM | POA: Diagnosis not present

## 2020-07-27 DIAGNOSIS — H353222 Exudative age-related macular degeneration, left eye, with inactive choroidal neovascularization: Secondary | ICD-10-CM | POA: Diagnosis not present

## 2020-08-12 DIAGNOSIS — Z85828 Personal history of other malignant neoplasm of skin: Secondary | ICD-10-CM | POA: Diagnosis not present

## 2020-08-12 DIAGNOSIS — D225 Melanocytic nevi of trunk: Secondary | ICD-10-CM | POA: Diagnosis not present

## 2020-08-12 DIAGNOSIS — L821 Other seborrheic keratosis: Secondary | ICD-10-CM | POA: Diagnosis not present

## 2020-08-12 DIAGNOSIS — L718 Other rosacea: Secondary | ICD-10-CM | POA: Diagnosis not present

## 2020-08-13 DIAGNOSIS — M19072 Primary osteoarthritis, left ankle and foot: Secondary | ICD-10-CM | POA: Diagnosis not present

## 2020-08-13 DIAGNOSIS — M79675 Pain in left toe(s): Secondary | ICD-10-CM | POA: Diagnosis not present

## 2020-08-13 DIAGNOSIS — M7742 Metatarsalgia, left foot: Secondary | ICD-10-CM | POA: Diagnosis not present

## 2020-09-02 ENCOUNTER — Ambulatory Visit
Admission: RE | Admit: 2020-09-02 | Discharge: 2020-09-02 | Disposition: A | Discharge status: Home / Self Care | Payer: Medicare Other | Source: Ambulatory Visit | Attending: Family Medicine | Admitting: Family Medicine

## 2020-09-02 ENCOUNTER — Other Ambulatory Visit: Payer: Self-pay

## 2020-09-02 DIAGNOSIS — Z1231 Encounter for screening mammogram for malignant neoplasm of breast: Secondary | ICD-10-CM

## 2020-10-26 DIAGNOSIS — H353114 Nonexudative age-related macular degeneration, right eye, advanced atrophic with subfoveal involvement: Secondary | ICD-10-CM | POA: Diagnosis not present

## 2020-10-26 DIAGNOSIS — H43813 Vitreous degeneration, bilateral: Secondary | ICD-10-CM | POA: Diagnosis not present

## 2020-10-26 DIAGNOSIS — H353222 Exudative age-related macular degeneration, left eye, with inactive choroidal neovascularization: Secondary | ICD-10-CM | POA: Diagnosis not present

## 2020-12-29 DIAGNOSIS — M899 Disorder of bone, unspecified: Secondary | ICD-10-CM | POA: Diagnosis not present

## 2020-12-29 DIAGNOSIS — Z0000 Encounter for general adult medical examination without abnormal findings: Secondary | ICD-10-CM | POA: Diagnosis not present

## 2020-12-29 DIAGNOSIS — I519 Heart disease, unspecified: Secondary | ICD-10-CM | POA: Diagnosis not present

## 2020-12-29 DIAGNOSIS — I10 Essential (primary) hypertension: Secondary | ICD-10-CM | POA: Diagnosis not present

## 2021-01-14 DIAGNOSIS — M25561 Pain in right knee: Secondary | ICD-10-CM | POA: Diagnosis not present

## 2021-01-17 DIAGNOSIS — H26493 Other secondary cataract, bilateral: Secondary | ICD-10-CM | POA: Diagnosis not present

## 2021-01-17 DIAGNOSIS — Z961 Presence of intraocular lens: Secondary | ICD-10-CM | POA: Diagnosis not present

## 2021-01-17 DIAGNOSIS — H353211 Exudative age-related macular degeneration, right eye, with active choroidal neovascularization: Secondary | ICD-10-CM | POA: Diagnosis not present

## 2021-01-25 DIAGNOSIS — H353114 Nonexudative age-related macular degeneration, right eye, advanced atrophic with subfoveal involvement: Secondary | ICD-10-CM | POA: Diagnosis not present

## 2021-01-25 DIAGNOSIS — H353222 Exudative age-related macular degeneration, left eye, with inactive choroidal neovascularization: Secondary | ICD-10-CM | POA: Diagnosis not present

## 2021-04-26 DIAGNOSIS — H353114 Nonexudative age-related macular degeneration, right eye, advanced atrophic with subfoveal involvement: Secondary | ICD-10-CM | POA: Diagnosis not present

## 2021-04-26 DIAGNOSIS — H353222 Exudative age-related macular degeneration, left eye, with inactive choroidal neovascularization: Secondary | ICD-10-CM | POA: Diagnosis not present

## 2021-04-26 DIAGNOSIS — H43813 Vitreous degeneration, bilateral: Secondary | ICD-10-CM | POA: Diagnosis not present

## 2021-04-26 DIAGNOSIS — H26493 Other secondary cataract, bilateral: Secondary | ICD-10-CM | POA: Diagnosis not present

## 2021-06-08 DIAGNOSIS — H26493 Other secondary cataract, bilateral: Secondary | ICD-10-CM | POA: Diagnosis not present

## 2021-06-08 DIAGNOSIS — Z961 Presence of intraocular lens: Secondary | ICD-10-CM | POA: Diagnosis not present

## 2021-06-23 DIAGNOSIS — H26491 Other secondary cataract, right eye: Secondary | ICD-10-CM | POA: Diagnosis not present

## 2021-06-23 DIAGNOSIS — H26492 Other secondary cataract, left eye: Secondary | ICD-10-CM | POA: Diagnosis not present

## 2021-07-07 DIAGNOSIS — M25511 Pain in right shoulder: Secondary | ICD-10-CM | POA: Diagnosis not present

## 2021-07-07 DIAGNOSIS — M25532 Pain in left wrist: Secondary | ICD-10-CM | POA: Diagnosis not present

## 2021-08-03 ENCOUNTER — Other Ambulatory Visit: Payer: Self-pay | Admitting: Family Medicine

## 2021-08-03 DIAGNOSIS — Z1231 Encounter for screening mammogram for malignant neoplasm of breast: Secondary | ICD-10-CM

## 2021-08-04 ENCOUNTER — Other Ambulatory Visit: Payer: Self-pay | Admitting: Family Medicine

## 2021-08-04 DIAGNOSIS — M8588 Other specified disorders of bone density and structure, other site: Secondary | ICD-10-CM

## 2021-08-04 DIAGNOSIS — Z1231 Encounter for screening mammogram for malignant neoplasm of breast: Secondary | ICD-10-CM

## 2021-08-16 DIAGNOSIS — D1801 Hemangioma of skin and subcutaneous tissue: Secondary | ICD-10-CM | POA: Diagnosis not present

## 2021-08-16 DIAGNOSIS — L821 Other seborrheic keratosis: Secondary | ICD-10-CM | POA: Diagnosis not present

## 2021-08-16 DIAGNOSIS — Z85828 Personal history of other malignant neoplasm of skin: Secondary | ICD-10-CM | POA: Diagnosis not present

## 2021-08-16 DIAGNOSIS — D225 Melanocytic nevi of trunk: Secondary | ICD-10-CM | POA: Diagnosis not present

## 2021-09-23 ENCOUNTER — Ambulatory Visit
Admission: RE | Admit: 2021-09-23 | Discharge: 2021-09-23 | Disposition: A | Discharge status: Home / Self Care | Payer: Medicare Other | Source: Ambulatory Visit | Attending: Family Medicine | Admitting: Family Medicine

## 2021-09-23 DIAGNOSIS — M8588 Other specified disorders of bone density and structure, other site: Secondary | ICD-10-CM

## 2021-09-23 DIAGNOSIS — Z1231 Encounter for screening mammogram for malignant neoplasm of breast: Secondary | ICD-10-CM | POA: Diagnosis not present

## 2021-09-23 DIAGNOSIS — M85852 Other specified disorders of bone density and structure, left thigh: Secondary | ICD-10-CM | POA: Diagnosis not present

## 2021-09-23 DIAGNOSIS — Z780 Asymptomatic menopausal state: Secondary | ICD-10-CM | POA: Diagnosis not present

## 2021-09-26 DIAGNOSIS — M25532 Pain in left wrist: Secondary | ICD-10-CM | POA: Diagnosis not present

## 2021-11-10 DIAGNOSIS — M19042 Primary osteoarthritis, left hand: Secondary | ICD-10-CM | POA: Diagnosis not present

## 2021-11-16 DIAGNOSIS — H43813 Vitreous degeneration, bilateral: Secondary | ICD-10-CM | POA: Diagnosis not present

## 2021-11-16 DIAGNOSIS — H26493 Other secondary cataract, bilateral: Secondary | ICD-10-CM | POA: Diagnosis not present

## 2021-11-16 DIAGNOSIS — H353222 Exudative age-related macular degeneration, left eye, with inactive choroidal neovascularization: Secondary | ICD-10-CM | POA: Diagnosis not present

## 2021-11-16 DIAGNOSIS — H353114 Nonexudative age-related macular degeneration, right eye, advanced atrophic with subfoveal involvement: Secondary | ICD-10-CM | POA: Diagnosis not present

## 2021-12-01 DIAGNOSIS — U071 COVID-19: Secondary | ICD-10-CM | POA: Diagnosis not present

## 2021-12-01 DIAGNOSIS — R509 Fever, unspecified: Secondary | ICD-10-CM | POA: Diagnosis not present

## 2021-12-01 DIAGNOSIS — R059 Cough, unspecified: Secondary | ICD-10-CM | POA: Diagnosis not present

## 2022-01-18 DIAGNOSIS — I2510 Atherosclerotic heart disease of native coronary artery without angina pectoris: Secondary | ICD-10-CM | POA: Diagnosis not present

## 2022-01-18 DIAGNOSIS — I10 Essential (primary) hypertension: Secondary | ICD-10-CM | POA: Diagnosis not present

## 2022-01-18 DIAGNOSIS — I519 Heart disease, unspecified: Secondary | ICD-10-CM | POA: Diagnosis not present

## 2022-01-18 DIAGNOSIS — M899 Disorder of bone, unspecified: Secondary | ICD-10-CM | POA: Diagnosis not present

## 2022-01-18 DIAGNOSIS — Z0000 Encounter for general adult medical examination without abnormal findings: Secondary | ICD-10-CM | POA: Diagnosis not present

## 2022-01-26 DIAGNOSIS — H26493 Other secondary cataract, bilateral: Secondary | ICD-10-CM | POA: Diagnosis not present

## 2022-01-26 DIAGNOSIS — H353222 Exudative age-related macular degeneration, left eye, with inactive choroidal neovascularization: Secondary | ICD-10-CM | POA: Diagnosis not present

## 2022-01-26 DIAGNOSIS — H43813 Vitreous degeneration, bilateral: Secondary | ICD-10-CM | POA: Diagnosis not present

## 2022-01-26 DIAGNOSIS — H353211 Exudative age-related macular degeneration, right eye, with active choroidal neovascularization: Secondary | ICD-10-CM | POA: Diagnosis not present

## 2022-02-23 DIAGNOSIS — H353211 Exudative age-related macular degeneration, right eye, with active choroidal neovascularization: Secondary | ICD-10-CM | POA: Diagnosis not present

## 2022-03-28 DIAGNOSIS — H0019 Chalazion unspecified eye, unspecified eyelid: Secondary | ICD-10-CM | POA: Diagnosis not present

## 2022-03-28 DIAGNOSIS — H43813 Vitreous degeneration, bilateral: Secondary | ICD-10-CM | POA: Diagnosis not present

## 2022-03-28 DIAGNOSIS — H26493 Other secondary cataract, bilateral: Secondary | ICD-10-CM | POA: Diagnosis not present

## 2022-03-28 DIAGNOSIS — H353223 Exudative age-related macular degeneration, left eye, with inactive scar: Secondary | ICD-10-CM | POA: Diagnosis not present

## 2022-03-28 DIAGNOSIS — H353211 Exudative age-related macular degeneration, right eye, with active choroidal neovascularization: Secondary | ICD-10-CM | POA: Diagnosis not present

## 2022-04-11 DIAGNOSIS — M67432 Ganglion, left wrist: Secondary | ICD-10-CM | POA: Diagnosis not present

## 2022-04-11 DIAGNOSIS — M25521 Pain in right elbow: Secondary | ICD-10-CM | POA: Diagnosis not present

## 2022-05-17 DIAGNOSIS — H26493 Other secondary cataract, bilateral: Secondary | ICD-10-CM | POA: Diagnosis not present

## 2022-05-17 DIAGNOSIS — H43813 Vitreous degeneration, bilateral: Secondary | ICD-10-CM | POA: Diagnosis not present

## 2022-05-17 DIAGNOSIS — H353211 Exudative age-related macular degeneration, right eye, with active choroidal neovascularization: Secondary | ICD-10-CM | POA: Diagnosis not present

## 2022-05-17 DIAGNOSIS — H353223 Exudative age-related macular degeneration, left eye, with inactive scar: Secondary | ICD-10-CM | POA: Diagnosis not present

## 2022-05-23 DIAGNOSIS — K080 Exfoliation of teeth due to systemic causes: Secondary | ICD-10-CM | POA: Diagnosis not present

## 2022-07-03 DIAGNOSIS — Z961 Presence of intraocular lens: Secondary | ICD-10-CM | POA: Diagnosis not present

## 2022-07-03 DIAGNOSIS — H04123 Dry eye syndrome of bilateral lacrimal glands: Secondary | ICD-10-CM | POA: Diagnosis not present

## 2022-07-03 DIAGNOSIS — H353232 Exudative age-related macular degeneration, bilateral, with inactive choroidal neovascularization: Secondary | ICD-10-CM | POA: Diagnosis not present

## 2022-07-26 DIAGNOSIS — H353223 Exudative age-related macular degeneration, left eye, with inactive scar: Secondary | ICD-10-CM | POA: Diagnosis not present

## 2022-07-26 DIAGNOSIS — H0019 Chalazion unspecified eye, unspecified eyelid: Secondary | ICD-10-CM | POA: Diagnosis not present

## 2022-07-26 DIAGNOSIS — H353211 Exudative age-related macular degeneration, right eye, with active choroidal neovascularization: Secondary | ICD-10-CM | POA: Diagnosis not present

## 2022-07-26 DIAGNOSIS — H43813 Vitreous degeneration, bilateral: Secondary | ICD-10-CM | POA: Diagnosis not present

## 2022-08-22 ENCOUNTER — Other Ambulatory Visit: Payer: Self-pay | Admitting: Family Medicine

## 2022-08-22 DIAGNOSIS — Z1231 Encounter for screening mammogram for malignant neoplasm of breast: Secondary | ICD-10-CM

## 2022-09-13 DIAGNOSIS — Z85828 Personal history of other malignant neoplasm of skin: Secondary | ICD-10-CM | POA: Diagnosis not present

## 2022-09-13 DIAGNOSIS — L812 Freckles: Secondary | ICD-10-CM | POA: Diagnosis not present

## 2022-09-13 DIAGNOSIS — L821 Other seborrheic keratosis: Secondary | ICD-10-CM | POA: Diagnosis not present

## 2022-09-13 DIAGNOSIS — D1801 Hemangioma of skin and subcutaneous tissue: Secondary | ICD-10-CM | POA: Diagnosis not present

## 2022-09-26 ENCOUNTER — Ambulatory Visit
Admission: RE | Admit: 2022-09-26 | Discharge: 2022-09-26 | Disposition: A | Discharge status: Home / Self Care | Payer: Medicare Other | Source: Ambulatory Visit | Attending: Family Medicine | Admitting: Family Medicine

## 2022-09-26 DIAGNOSIS — Z1231 Encounter for screening mammogram for malignant neoplasm of breast: Secondary | ICD-10-CM | POA: Diagnosis not present

## 2022-10-10 DIAGNOSIS — H353211 Exudative age-related macular degeneration, right eye, with active choroidal neovascularization: Secondary | ICD-10-CM | POA: Diagnosis not present

## 2022-10-10 DIAGNOSIS — H26493 Other secondary cataract, bilateral: Secondary | ICD-10-CM | POA: Diagnosis not present

## 2022-10-10 DIAGNOSIS — H43813 Vitreous degeneration, bilateral: Secondary | ICD-10-CM | POA: Diagnosis not present

## 2023-01-02 DIAGNOSIS — H353134 Nonexudative age-related macular degeneration, bilateral, advanced atrophic with subfoveal involvement: Secondary | ICD-10-CM | POA: Diagnosis not present

## 2023-01-02 DIAGNOSIS — H43813 Vitreous degeneration, bilateral: Secondary | ICD-10-CM | POA: Diagnosis not present

## 2023-01-02 DIAGNOSIS — H26493 Other secondary cataract, bilateral: Secondary | ICD-10-CM | POA: Diagnosis not present

## 2023-01-02 DIAGNOSIS — H353223 Exudative age-related macular degeneration, left eye, with inactive scar: Secondary | ICD-10-CM | POA: Diagnosis not present

## 2023-01-02 DIAGNOSIS — H353211 Exudative age-related macular degeneration, right eye, with active choroidal neovascularization: Secondary | ICD-10-CM | POA: Diagnosis not present

## 2023-01-25 DIAGNOSIS — I2510 Atherosclerotic heart disease of native coronary artery without angina pectoris: Secondary | ICD-10-CM | POA: Diagnosis not present

## 2023-01-25 DIAGNOSIS — I10 Essential (primary) hypertension: Secondary | ICD-10-CM | POA: Diagnosis not present

## 2023-01-25 DIAGNOSIS — G250 Essential tremor: Secondary | ICD-10-CM | POA: Diagnosis not present

## 2023-01-25 DIAGNOSIS — I519 Heart disease, unspecified: Secondary | ICD-10-CM | POA: Diagnosis not present

## 2023-01-25 DIAGNOSIS — H353222 Exudative age-related macular degeneration, left eye, with inactive choroidal neovascularization: Secondary | ICD-10-CM | POA: Diagnosis not present

## 2023-01-25 DIAGNOSIS — Z0000 Encounter for general adult medical examination without abnormal findings: Secondary | ICD-10-CM | POA: Diagnosis not present

## 2023-01-25 DIAGNOSIS — Z131 Encounter for screening for diabetes mellitus: Secondary | ICD-10-CM | POA: Diagnosis not present

## 2023-01-25 DIAGNOSIS — M899 Disorder of bone, unspecified: Secondary | ICD-10-CM | POA: Diagnosis not present

## 2023-01-30 DIAGNOSIS — H353134 Nonexudative age-related macular degeneration, bilateral, advanced atrophic with subfoveal involvement: Secondary | ICD-10-CM | POA: Diagnosis not present

## 2023-02-05 DIAGNOSIS — K080 Exfoliation of teeth due to systemic causes: Secondary | ICD-10-CM | POA: Diagnosis not present

## 2023-02-27 DIAGNOSIS — H353211 Exudative age-related macular degeneration, right eye, with active choroidal neovascularization: Secondary | ICD-10-CM | POA: Diagnosis not present

## 2023-02-27 DIAGNOSIS — H353223 Exudative age-related macular degeneration, left eye, with inactive scar: Secondary | ICD-10-CM | POA: Diagnosis not present

## 2023-02-27 DIAGNOSIS — H26493 Other secondary cataract, bilateral: Secondary | ICD-10-CM | POA: Diagnosis not present

## 2023-02-27 DIAGNOSIS — H43813 Vitreous degeneration, bilateral: Secondary | ICD-10-CM | POA: Diagnosis not present

## 2023-02-27 DIAGNOSIS — H353134 Nonexudative age-related macular degeneration, bilateral, advanced atrophic with subfoveal involvement: Secondary | ICD-10-CM | POA: Diagnosis not present

## 2023-03-12 DIAGNOSIS — M5417 Radiculopathy, lumbosacral region: Secondary | ICD-10-CM | POA: Diagnosis not present

## 2023-03-20 ENCOUNTER — Other Ambulatory Visit: Payer: Self-pay | Admitting: Family Medicine

## 2023-03-20 DIAGNOSIS — R202 Paresthesia of skin: Secondary | ICD-10-CM

## 2023-03-22 DIAGNOSIS — H903 Sensorineural hearing loss, bilateral: Secondary | ICD-10-CM | POA: Diagnosis not present

## 2023-03-27 DIAGNOSIS — H353134 Nonexudative age-related macular degeneration, bilateral, advanced atrophic with subfoveal involvement: Secondary | ICD-10-CM | POA: Diagnosis not present

## 2023-03-29 ENCOUNTER — Ambulatory Visit
Admission: RE | Admit: 2023-03-29 | Discharge: 2023-03-29 | Disposition: A | Discharge status: Home / Self Care | Payer: Medicare Other | Source: Ambulatory Visit | Attending: Family Medicine | Admitting: Family Medicine

## 2023-03-29 DIAGNOSIS — R202 Paresthesia of skin: Secondary | ICD-10-CM

## 2023-03-29 DIAGNOSIS — M549 Dorsalgia, unspecified: Secondary | ICD-10-CM | POA: Diagnosis not present

## 2023-04-23 DIAGNOSIS — M5416 Radiculopathy, lumbar region: Secondary | ICD-10-CM | POA: Diagnosis not present

## 2023-04-24 DIAGNOSIS — H353134 Nonexudative age-related macular degeneration, bilateral, advanced atrophic with subfoveal involvement: Secondary | ICD-10-CM | POA: Diagnosis not present

## 2023-04-24 DIAGNOSIS — H26493 Other secondary cataract, bilateral: Secondary | ICD-10-CM | POA: Diagnosis not present

## 2023-04-24 DIAGNOSIS — H43813 Vitreous degeneration, bilateral: Secondary | ICD-10-CM | POA: Diagnosis not present

## 2023-04-24 DIAGNOSIS — H353211 Exudative age-related macular degeneration, right eye, with active choroidal neovascularization: Secondary | ICD-10-CM | POA: Diagnosis not present

## 2023-04-27 IMAGING — MG MM DIGITAL SCREENING BILAT W/ TOMO AND CAD
8 series · 9 of 24 positions shown · non-contrast
Comparison: Previous exam(s).

CLINICAL DATA: Screening.

EXAM:
DIGITAL SCREENING BILATERAL MAMMOGRAM WITH TOMOSYNTHESIS AND CAD
TECHNIQUE: Bilateral screening digital craniocaudal and mediolateral oblique
mammograms were obtained. Bilateral screening digital breast
tomosynthesis was performed. The images were evaluated with
computer-aided detection.

[R MLO synth-2D]
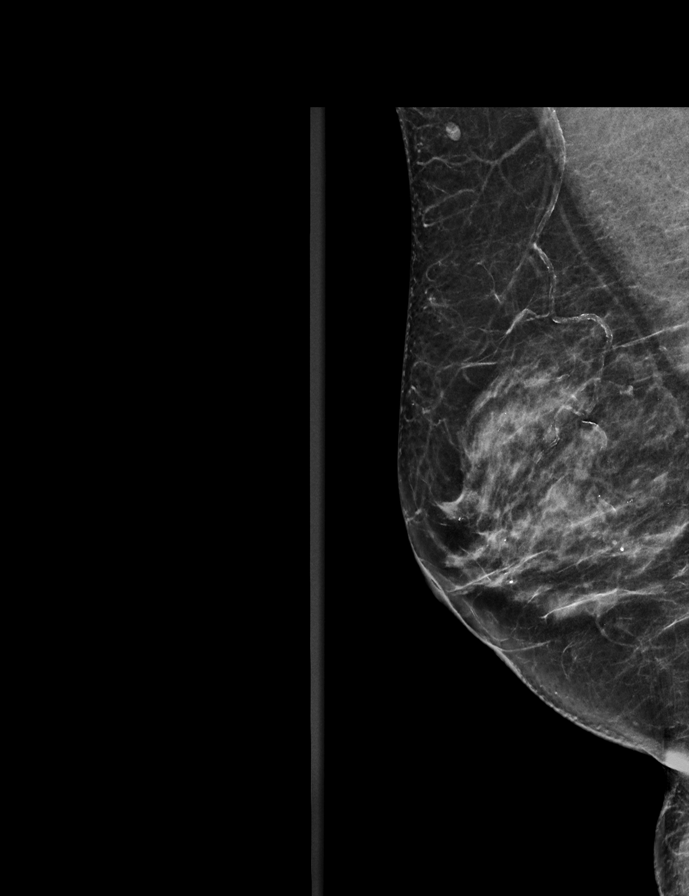

[L MLO synth-2D]
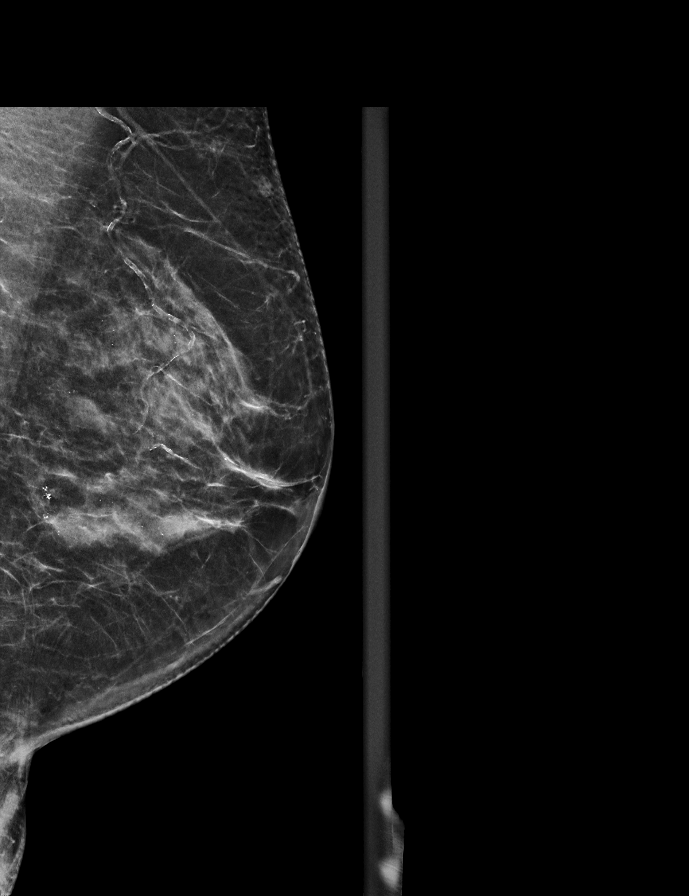

[L CC synth-2D]
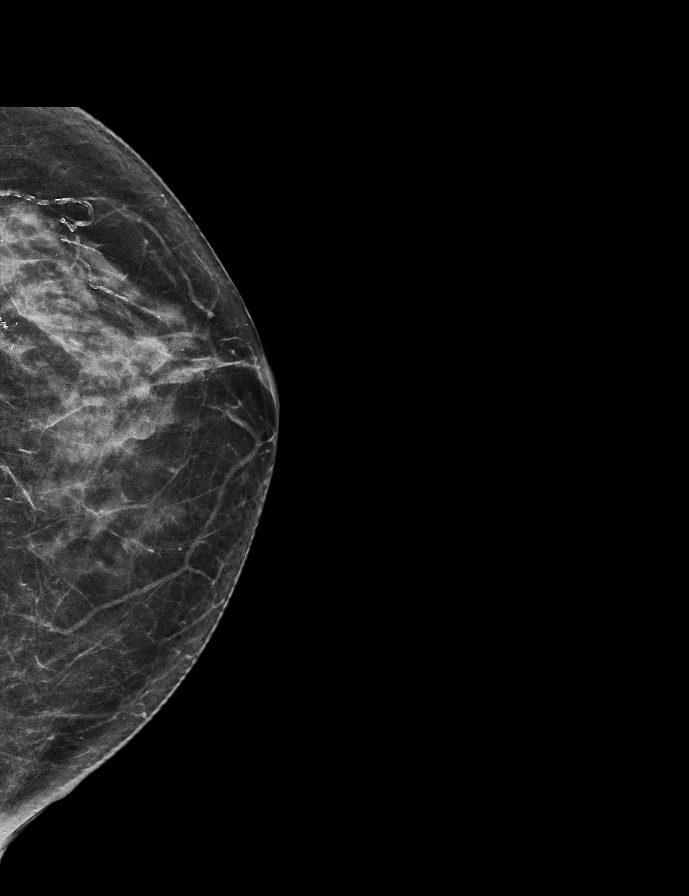

[R CC synth-2D]
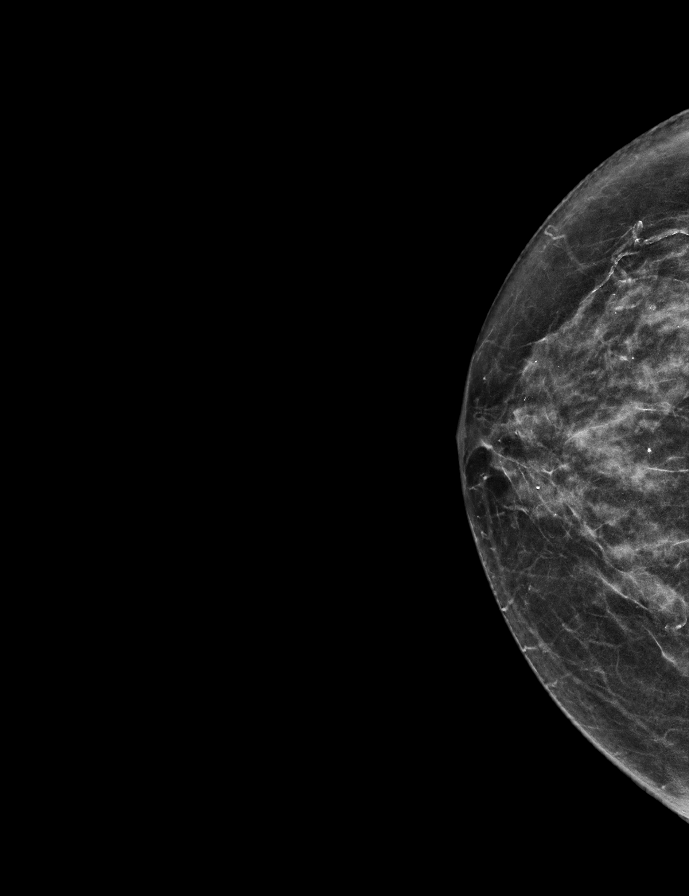

[L MLO tomo · 2 of 64 frames shown]
[frame 21/64]
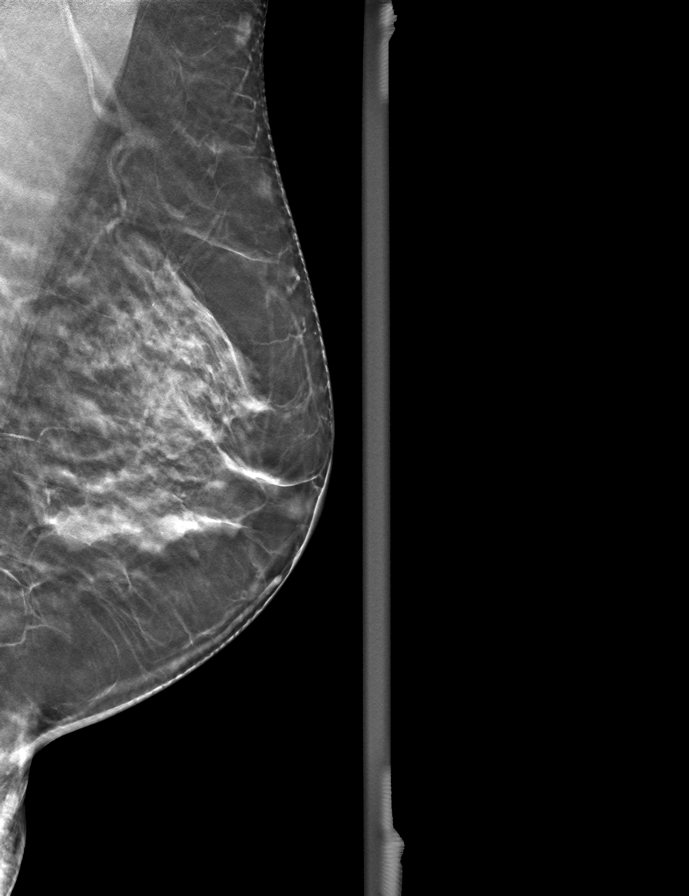
[frame 33/64]
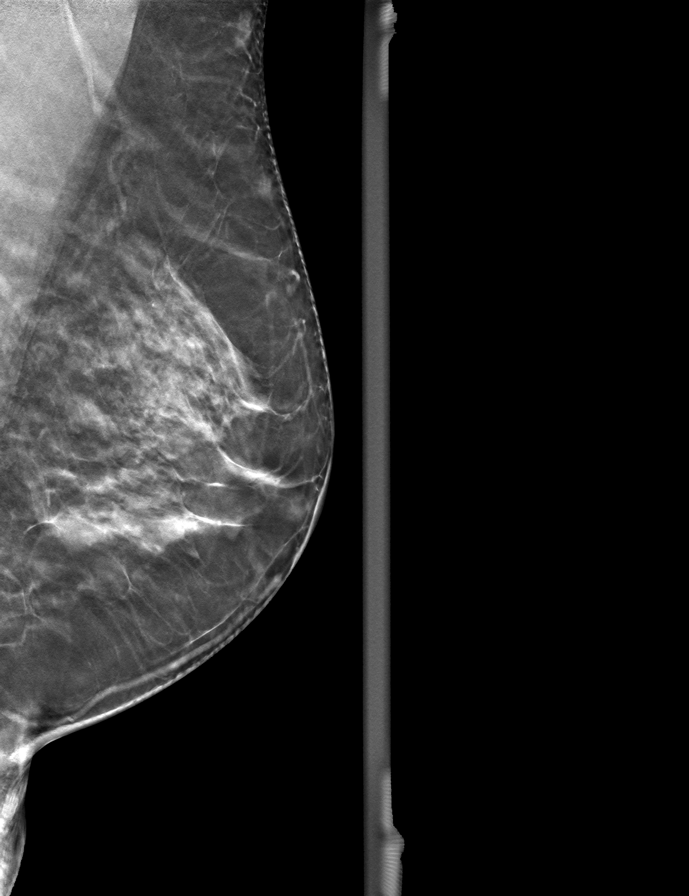

[R MLO tomo · tomo slice 32/63.0]
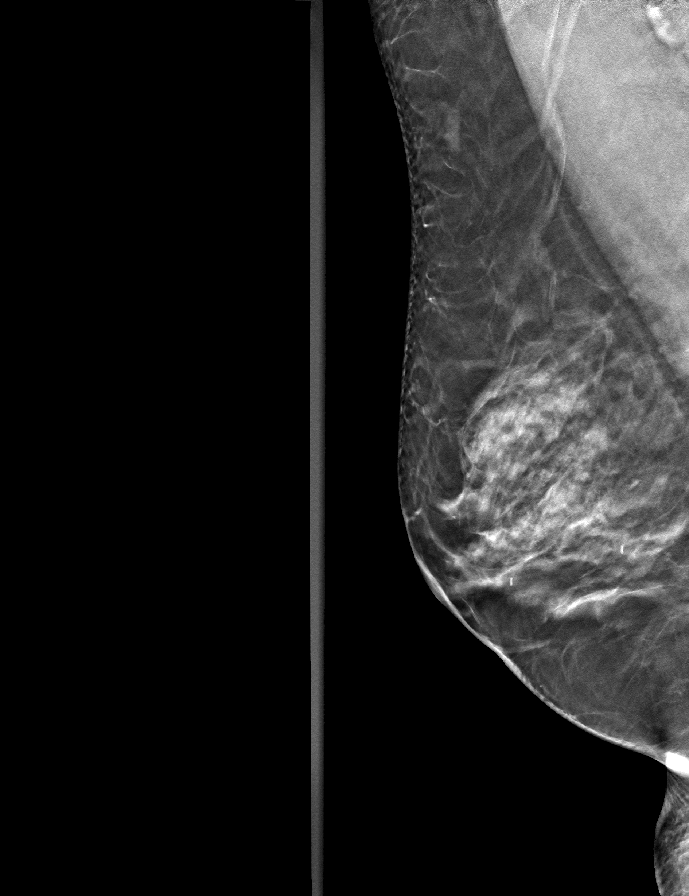

[L CC tomo · tomo slice 31/60.0]
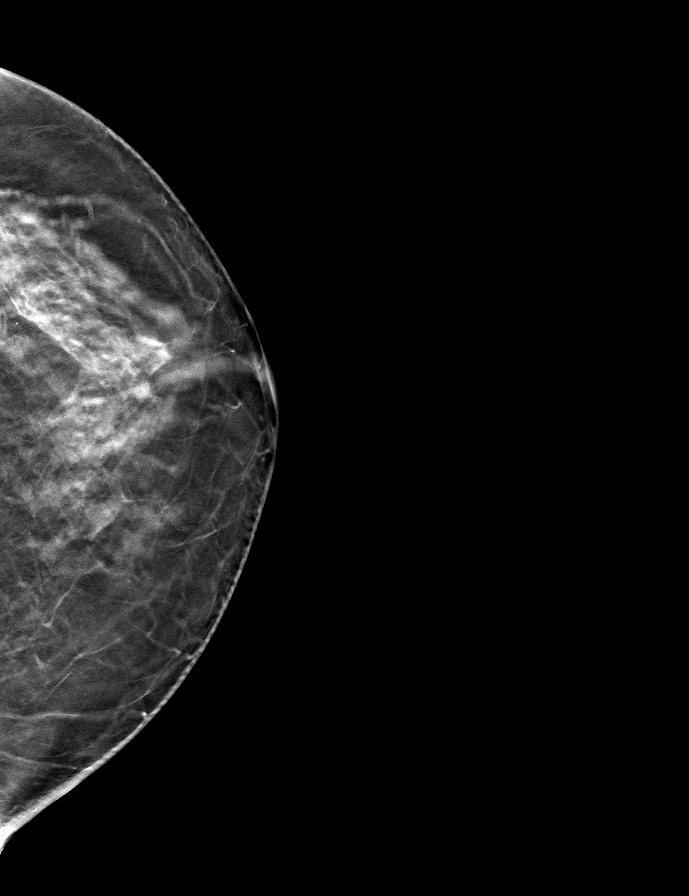

[R CC tomo · tomo slice 29/56.0]
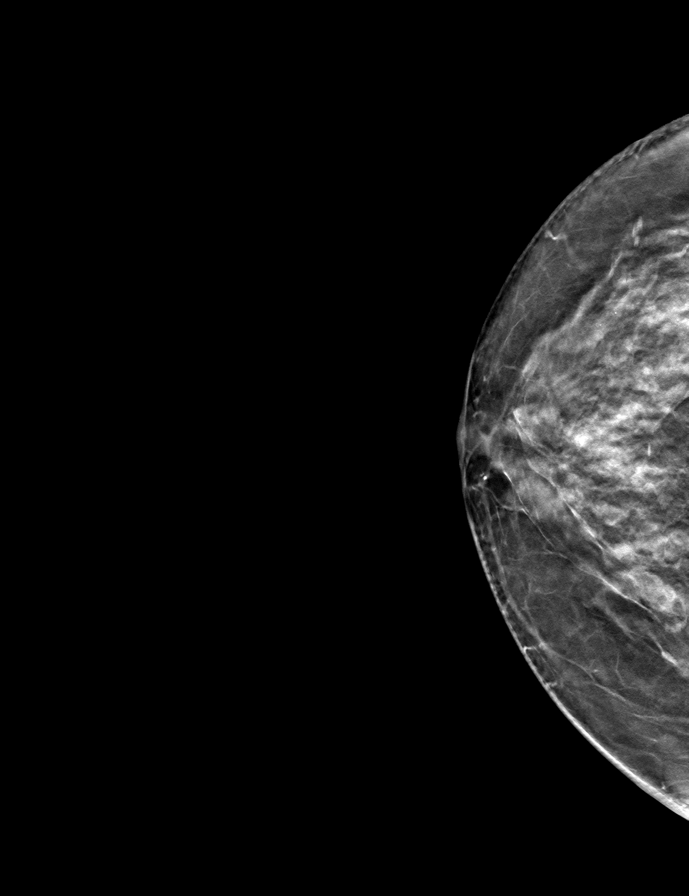

[9 of 24 positions shown; findings below may reference images not displayed]

ACR Breast Density Category c: The breast tissue is heterogeneously
dense, which may obscure small masses.
FINDINGS: There are no findings suspicious for malignancy.
IMPRESSION: No mammographic evidence of malignancy. A result letter of this
screening mammogram will be mailed directly to the patient.

RECOMMENDATION:
Screening mammogram in one year. (Code:Q3-W-BC3)

BI-RADS CATEGORY  1: Negative.

## 2023-05-15 DIAGNOSIS — M5416 Radiculopathy, lumbar region: Secondary | ICD-10-CM | POA: Diagnosis not present

## 2023-05-15 DIAGNOSIS — M545 Low back pain, unspecified: Secondary | ICD-10-CM | POA: Diagnosis not present

## 2023-05-22 DIAGNOSIS — H353134 Nonexudative age-related macular degeneration, bilateral, advanced atrophic with subfoveal involvement: Secondary | ICD-10-CM | POA: Diagnosis not present

## 2023-05-25 DIAGNOSIS — M5416 Radiculopathy, lumbar region: Secondary | ICD-10-CM | POA: Diagnosis not present

## 2023-05-25 DIAGNOSIS — M545 Low back pain, unspecified: Secondary | ICD-10-CM | POA: Diagnosis not present

## 2023-06-05 DIAGNOSIS — K080 Exfoliation of teeth due to systemic causes: Secondary | ICD-10-CM | POA: Diagnosis not present

## 2023-07-03 DIAGNOSIS — M79671 Pain in right foot: Secondary | ICD-10-CM | POA: Diagnosis not present

## 2023-07-05 DIAGNOSIS — H524 Presbyopia: Secondary | ICD-10-CM | POA: Diagnosis not present

## 2023-07-17 DIAGNOSIS — H26493 Other secondary cataract, bilateral: Secondary | ICD-10-CM | POA: Diagnosis not present

## 2023-07-17 DIAGNOSIS — H43813 Vitreous degeneration, bilateral: Secondary | ICD-10-CM | POA: Diagnosis not present

## 2023-07-17 DIAGNOSIS — H353134 Nonexudative age-related macular degeneration, bilateral, advanced atrophic with subfoveal involvement: Secondary | ICD-10-CM | POA: Diagnosis not present

## 2023-07-24 DIAGNOSIS — M19071 Primary osteoarthritis, right ankle and foot: Secondary | ICD-10-CM | POA: Diagnosis not present

## 2023-08-28 ENCOUNTER — Other Ambulatory Visit (HOSPITAL_BASED_OUTPATIENT_CLINIC_OR_DEPARTMENT_OTHER): Payer: Self-pay | Admitting: Family Medicine

## 2023-08-28 DIAGNOSIS — M8588 Other specified disorders of bone density and structure, other site: Secondary | ICD-10-CM

## 2023-08-30 ENCOUNTER — Other Ambulatory Visit: Payer: Self-pay | Admitting: Family Medicine

## 2023-08-30 DIAGNOSIS — Z1231 Encounter for screening mammogram for malignant neoplasm of breast: Secondary | ICD-10-CM

## 2023-09-12 DIAGNOSIS — H353134 Nonexudative age-related macular degeneration, bilateral, advanced atrophic with subfoveal involvement: Secondary | ICD-10-CM | POA: Diagnosis not present

## 2023-09-12 DIAGNOSIS — H43813 Vitreous degeneration, bilateral: Secondary | ICD-10-CM | POA: Diagnosis not present

## 2023-09-12 DIAGNOSIS — H26493 Other secondary cataract, bilateral: Secondary | ICD-10-CM | POA: Diagnosis not present

## 2023-09-24 DIAGNOSIS — D692 Other nonthrombocytopenic purpura: Secondary | ICD-10-CM | POA: Diagnosis not present

## 2023-09-24 DIAGNOSIS — L812 Freckles: Secondary | ICD-10-CM | POA: Diagnosis not present

## 2023-09-24 DIAGNOSIS — Z85828 Personal history of other malignant neoplasm of skin: Secondary | ICD-10-CM | POA: Diagnosis not present

## 2023-09-24 DIAGNOSIS — L821 Other seborrheic keratosis: Secondary | ICD-10-CM | POA: Diagnosis not present

## 2023-09-28 ENCOUNTER — Ambulatory Visit
Admission: RE | Admit: 2023-09-28 | Discharge: 2023-09-28 | Disposition: A | Discharge status: Home / Self Care | Source: Ambulatory Visit | Attending: Family Medicine | Admitting: Family Medicine

## 2023-09-28 DIAGNOSIS — Z1231 Encounter for screening mammogram for malignant neoplasm of breast: Secondary | ICD-10-CM | POA: Diagnosis not present

## 2023-10-09 DIAGNOSIS — K080 Exfoliation of teeth due to systemic causes: Secondary | ICD-10-CM | POA: Diagnosis not present

## 2023-11-07 DIAGNOSIS — H353212 Exudative age-related macular degeneration, right eye, with inactive choroidal neovascularization: Secondary | ICD-10-CM | POA: Diagnosis not present

## 2023-11-07 DIAGNOSIS — H353223 Exudative age-related macular degeneration, left eye, with inactive scar: Secondary | ICD-10-CM | POA: Diagnosis not present

## 2023-11-07 DIAGNOSIS — H43813 Vitreous degeneration, bilateral: Secondary | ICD-10-CM | POA: Diagnosis not present

## 2023-11-07 DIAGNOSIS — H26493 Other secondary cataract, bilateral: Secondary | ICD-10-CM | POA: Diagnosis not present

## 2023-11-07 DIAGNOSIS — H353134 Nonexudative age-related macular degeneration, bilateral, advanced atrophic with subfoveal involvement: Secondary | ICD-10-CM | POA: Diagnosis not present

## 2023-12-21 DIAGNOSIS — M810 Age-related osteoporosis without current pathological fracture: Secondary | ICD-10-CM | POA: Diagnosis not present

## 2024-01-08 DIAGNOSIS — H43813 Vitreous degeneration, bilateral: Secondary | ICD-10-CM | POA: Diagnosis not present

## 2024-01-08 DIAGNOSIS — H353223 Exudative age-related macular degeneration, left eye, with inactive scar: Secondary | ICD-10-CM | POA: Diagnosis not present

## 2024-01-08 DIAGNOSIS — H353134 Nonexudative age-related macular degeneration, bilateral, advanced atrophic with subfoveal involvement: Secondary | ICD-10-CM | POA: Diagnosis not present

## 2024-01-08 DIAGNOSIS — H26493 Other secondary cataract, bilateral: Secondary | ICD-10-CM | POA: Diagnosis not present

## 2024-01-08 DIAGNOSIS — H353212 Exudative age-related macular degeneration, right eye, with inactive choroidal neovascularization: Secondary | ICD-10-CM | POA: Diagnosis not present

## 2024-01-28 DIAGNOSIS — Z1331 Encounter for screening for depression: Secondary | ICD-10-CM | POA: Diagnosis not present

## 2024-01-28 DIAGNOSIS — M899 Disorder of bone, unspecified: Secondary | ICD-10-CM | POA: Diagnosis not present

## 2024-01-28 DIAGNOSIS — I519 Heart disease, unspecified: Secondary | ICD-10-CM | POA: Diagnosis not present

## 2024-01-28 DIAGNOSIS — H353222 Exudative age-related macular degeneration, left eye, with inactive choroidal neovascularization: Secondary | ICD-10-CM | POA: Diagnosis not present

## 2024-01-28 DIAGNOSIS — Z0000 Encounter for general adult medical examination without abnormal findings: Secondary | ICD-10-CM | POA: Diagnosis not present

## 2024-01-28 DIAGNOSIS — Z131 Encounter for screening for diabetes mellitus: Secondary | ICD-10-CM | POA: Diagnosis not present

## 2024-01-28 DIAGNOSIS — I2510 Atherosclerotic heart disease of native coronary artery without angina pectoris: Secondary | ICD-10-CM | POA: Diagnosis not present

## 2024-01-28 DIAGNOSIS — I10 Essential (primary) hypertension: Secondary | ICD-10-CM | POA: Diagnosis not present

## 2024-05-12 ENCOUNTER — Other Ambulatory Visit (HOSPITAL_BASED_OUTPATIENT_CLINIC_OR_DEPARTMENT_OTHER)
